# Patient Record
Sex: Female | Born: 1960 | Race: White | Hispanic: No | Marital: Married | Smoking: Never smoker
Health system: Southern US, Community
[De-identification: ages and names within clinical notes are randomized; demographics above are authoritative.]

## PROBLEM LIST (undated history)

## (undated) DIAGNOSIS — I10 Essential (primary) hypertension: Secondary | ICD-10-CM

## (undated) HISTORY — PX: EYE MUSCLE SURGERY: SHX370

## (undated) HISTORY — PX: ABDOMINAL HYSTERECTOMY: SHX81

---

## 2004-05-02 ENCOUNTER — Observation Stay (HOSPITAL_COMMUNITY): Admission: AD | Admit: 2004-05-02 | Discharge: 2004-05-03 | Payer: Self-pay | Admitting: Obstetrics & Gynecology

## 2004-06-12 ENCOUNTER — Inpatient Hospital Stay (HOSPITAL_COMMUNITY): Admission: RE | Admit: 2004-06-12 | Discharge: 2004-06-14 | Payer: Self-pay | Admitting: Obstetrics & Gynecology

## 2007-10-20 ENCOUNTER — Ambulatory Visit (HOSPITAL_COMMUNITY): Admission: RE | Admit: 2007-10-20 | Discharge: 2007-10-20 | Payer: Self-pay | Admitting: Obstetrics & Gynecology

## 2011-05-17 NOTE — Op Note (Signed)
Natalie Harris, Natalie Harris                       ACCOUNT NO.:  [id number]   MEDICAL RECORD NO.:  [id number]                   PATIENT TYPE:  AMB   LOCATION:  DAY                                  FACILITY:  [facility]   PHYSICIAN:  Lazaro Arms, M.D.                DATE OF BIRTH:  10/27/1961   DATE OF PROCEDURE:  06/12/2004  DATE OF DISCHARGE:                                 OPERATIVE REPORT   PREOPERATIVE DIAGNOSES:  1. Enlarged fibroid uterus, 20-week size.  2. Menometrorrhagia.   POSTOPERATIVE DIAGNOSES:  1. Enlarged fibroid uterus, 20-week size.  2. Menometrorrhagia.   PROCEDURE:  Abdominal hysterectomy.   SURGEON:  Lazaro Arms, M.D.   ANESTHESIA:  General endotracheal.   FINDINGS:  The patient had a multi-fibroid uterus.  The ovaries were quite  small and appeared to be perimenopausal.  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   DESCRIPTION OF PROCEDURE:  The patient was taken to the operating room and  placed in the supine position where she underwent general endotracheal  anesthesia.  She was prepped and draped in the usual sterile fashion,  including the vagina.   A Pfannenstiel skin incision was made and carried down sharply to the rectus  fascia.  The peritoneal cavity was entered in the usual fashion.  The uterus  was delivered through the incision.  There was no way to get a retractor in  at this time.  The Gyrus bipolar curved forceps was used, and the ovaries  were taken down off of the uterus.  The uterus basically filled the entire  pelvis from sidewall to sidewall and again rose out of the pelvis at the  level of the umbilicus.  The round ligaments were clamped, coagulated, and  cut.  The bladder was then taken down.  The bladder peritoneum was incised  from side to side, and the bladder was pushed down with a sponge stick.  I  clamped the uterine vessels  above and below and amputated the large fundus  in order to be able to get to the uterine vessels on each side.  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  Actually there were two.  The uterine vessels were clamped, cut, and suture ligated bilaterally.  Serial pedicles were taken down the cervix through the cardinal ligament,  each pedicle being clamped, cut, and suture ligated.  The vagina was  crossclamped, and the cervical stump and lower part of the uterus was  removed.  Vaginal angle sutures were placed, and the vagina was closed with  two interrupted figure-of-eight sutures.  There was good hemostasis of all  pedicles.  The pelvis was irrigated vigorously.  Interceed was placed in the  vaginal cuff to attempt to prevent adhesions in the future.  The  lap tapes  were removed.  The muscles were reapproximated loosely.  The fascia was  closed using 0 Vicryl running.  The subcutaneous tissue was made hemostatic  and irrigated.  A pain pump was placed in the subcutaneous tissue for  postoperative pain management.  The skin was closed using skin staples.   The patient tolerated the procedure well.  She experienced 600 cc of blood  loss and was taken to the recovery room in good and stable condition.  All  counts were correct.      ___________________________________________                                            Lazaro Arms, M.D.   LHE/MEDQ  D:  06/12/2004  T:  06/12/2004  Job:  [id number]

## 2011-05-17 NOTE — H&P (Signed)
NAME:  Natalie Harris, Natalie Harris                       ACCOUNT NO.:  [id number]   MEDICAL RECORD NO.:  [id number]                   PATIENT TYPE:  AMB   LOCATION:  DAY                                  FACILITY:  [facility]   PHYSICIAN:  Lazaro Arms, M.D.                DATE OF BIRTH:  03-Mar-1961   DATE OF ADMISSION:  06/12/2004  DATE OF DISCHARGE:                                HISTORY & PHYSICAL   HISTORY OF PRESENT ILLNESS:  Natalie Harris is a 50 year old white female, gravida  0, para 0, who I have followed in the office since May 02, 2004 and admitted  that day because of severe anemia.  Her hemoglobin was 3.6 and hematocrit  was 13.  She got 5 units of blood, which increased her hemoglobin up to 9.  She was placed on Megace therapy aggressively.  She has an enlarged fibroid  uterus, 18-week size, and preparations were made at that time for her to  undergo surgery on the 14th.  Ultrasound confirmed the presence of an  enlarged irregular fibroid uterus; as a result, Natalie Harris is admitted for  abdominal hysterectomy.   PAST MEDICAL HISTORY:  Past medical history negative except for a severe  anemia resulting from longstanding menometrorrhagia and requiring 5 units of  packed red blood cells on May 02, 2004.   PAST SURGICAL HISTORY:  Past surgical history is negative.   OBSTETRICAL HISTORY:  OB history is negative.   ALLERGIES:  Allergies are to AMOXICILLIN.   CURRENT MEDICATIONS:  Current medications are Megace and iron.   REVIEW OF SYSTEMS:  Review of systems is otherwise negative.   PHYSICAL EXAMINATION:  VITAL SIGNS:  Blood pressure is 140/70.  HEENT:  Unremarkable.  NECK:  Thyroid is normal.  LUNGS:  Lungs are clear.  HEART:  Heart is regular rate and rhythm without murmur, regurgitation or  gallop.  The systolic ejection murmur has resolved.  ABDOMEN:  Her abdominal exam is benign but there is a mass palpable on  abdominal exam.  PELVIC:  Her Pap smear was normal.  She has normal external  genitalia.  Vaginal is pink and moist without discharge.  The cervix is parous without  lesions.  Uterus is 18- to 20-week size.  Adnexa are negative.  EXTREMITIES:  Extremities are warm with no edema.   IMPRESSION:  1. Enlarged fibroid uterus, 18- to 20-week size.  2. History of menometrorrhagia, resolved on Megace.  3. Status post 5 units of packed red blood cells because of severe anemia,     symptomatic.   PLAN:  The patient is admitted for abdominal hysterectomy.  She understands  the risks, benefits, indications and alternatives and will proceed.     ___________________________________________  Lazaro Arms, M.D.   Loraine Maple  D:  06/11/2004  T:  06/12/2004  Job:  [id number]

## 2011-05-17 NOTE — H&P (Signed)
NAME:  Natalie, Harris NO.:  [id number]   MEDICAL RECORD NO.:  [id number]                  PATIENT TYPE:   LOCATION:                                       FACILITY:   PHYSICIAN:  Lazaro Arms, M.D.                DATE OF BIRTH:  [birth year]   DATE OF ADMISSION:  DATE OF DISCHARGE:                                HISTORY & PHYSICAL   HISTORY OF PRESENT ILLNESS:  The patient is a 50 year old white female  Gravida O, Para 0 with a last menstrual period which just ended on Apr 29, 2004 who came to the office for a yearly, but also with several systemic  complaints.  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  She also is complaining of her heart racing at  times and she has had some changes in her menstrual cycles over the last few  months.  She has most recently bled quite heavily for a three week period of  time with blood clotting heavier than what she is used to.  On examination I  noticed a 3/6 systolic ejection murmur and then looked at her conjunctivae  which were very pale.  I examined her and her uterus is 18 week size.  Putting those things together, we checked a blood count in the office.  A  fingerstick was a hemoglobin of 3.8 and we checked that at the hospital and  it was 3.6 and hematocrit was 13.0.  As a result, she is admitted to the  hospital to receive blood transfusions.  The severe anemia is almost  certainly from her heavy bleeding. Her platelet count is normal.  Her white  count is slightly lowered.  Her MCV is in the 60's consistent with iron  deficiency anemia.   PAST MEDICAL HISTORY:  Negative.   PAST SURGICAL HISTORY:  Negative.   OB HISTORY:  Negative.   ALLERGIES:  AMOXICILLIN.   CURRENT MEDICATIONS:  None.   REVIEW OF  SYMPTOMS:  Negative.   PHYSICAL EXAMINATION:  VITAL SIGNS:  Blood pressure 140/70.  HEENT: Unremarkable.  The thyroid is normal.  LUNGS:  Clear.  CARDIOVASCULAR EXAM:  Regular rate and rhythm as stated before, but a 3/6  systolic ejection murmur.  ABDOMEN:  Benign.  PELVIC:  I did feel a mass on examination on pelvic exam.  I did a Pap  smear.  External genitalia was normal but the pelvic exam revealed that to  be an enlarged uterus at 18 weeks size with multiple masses palpated  consistent with fibroids.  The adnexae are negative.  EXTREMITIES:  Warm with no edema.  NEUROLOGIC:  Grossly intact.   IMPRESSION:  1. Severe  iron deficiency anemia secondary to menometrorrhagia.  2. Enlarged fibroid uterus.  3. Systemic complaints related to #1.   PLAN:  The patient is admitted as an observation patient overnight to  receive blood transfusions.  We will then put her on Megace to cease her  menses and plan to do a hysterectomy on June 14.  The patient understands  the seriousness of her condition and the need for immediate hospitalization  and therapy.     ___________________________________________                                         Lazaro Arms, M.D.   LHE/MEDQ  D:  05/02/2004  T:  05/02/2004  Job:  [id number]

## 2020-03-18 ENCOUNTER — Ambulatory Visit: Payer: Self-pay | Attending: Internal Medicine

## 2020-03-18 DIAGNOSIS — Z23 Encounter for immunization: Secondary | ICD-10-CM

## 2020-03-18 NOTE — Progress Notes (Signed)
   Covid-19 Vaccination Clinic  Name:  Natalie Harris    MRN: [id number] DOB: 1961-04-12  03/18/2020  [name] was observed post Covid-19 immunization for 15 minutes without incident. She was provided with Vaccine Information Sheet and instruction to access the V-Safe system.   [name] was instructed to call 911 with any severe reactions post vaccine: Marland Kitchen Difficulty breathing  . Swelling of face and throat  . A fast heartbeat  . A bad rash all over body  . Dizziness and weakness   Immunizations Administered    Name Date Dose VIS Date Route   Moderna COVID-19 Vaccine 03/18/2020 11:11 AM 0.5 mL 11/30/2019 Intramuscular   Manufacturer: Moderna   Lot: [id number]   NDC: 06986-148-30

## 2020-04-25 ENCOUNTER — Ambulatory Visit: Payer: Self-pay | Attending: Internal Medicine

## 2020-04-25 DIAGNOSIS — Z23 Encounter for immunization: Secondary | ICD-10-CM

## 2020-04-25 NOTE — Progress Notes (Signed)
   Covid-19 Vaccination Clinic  Name:  ANNALISIA INGBER    MRN: [id number] DOB: [birth year]  04/25/2020  Ms. Furgason was observed post Covid-19 immunization for 15 minutes without incident. She was provided with Vaccine Information Sheet and instruction to access the V-Safe system.   Ms. Petrovich was instructed to call 911 with any severe reactions post vaccine: Marland Kitchen Difficulty breathing  . Swelling of face and throat  . A fast heartbeat  . A bad rash all over body  . Dizziness and weakness   Immunizations Administered    Name Date Dose VIS Date Route   Moderna COVID-19 Vaccine 04/25/2020 10:57 AM 0.5 mL 11/2019 Intramuscular   Manufacturer: Moderna   Lot: [id number]   NDC: 72419-542-48

## 2020-05-18 NOTE — H&P (Signed)
Surgical History & Physical  Patient Name: Natalie Harris DOB: 07-31-1961  Surgery: Cataract extraction with intraocular lens implant phacoemulsification; Right Eye  Surgeon: Fabio Pierce MD Surgery Date:  06/02/2020 Pre-Op Date:  05/15/2020  HPI: A 86 Yr. old female patient -Dr. Charise Killian referred for cataract eval 1. The patient complains of difficulty when driving, which began 1 years ago. Both eyes are affected. The episode is constant and gradual. The condition's severity decreased since last visit and is moderate. OD worse c/w OS distance and near. Symptoms occur when the patient is driving and reading. This is negatively affecting the patient's quality of life. Patient not able to see well with current bifocal CL OD c/w OS. OS cloudy as well but OD much worse. *Bifocal CL HPI Completed by Dr. Fabio Pierce  Medical History: Cataracts Allergies migraines (triggered by lights and/or s...  Review of Systems Negative Allergic/Immunologic Negative Cardiovascular Negative Constitutional Negative Ear, Nose, Mouth & Throat Negative Endocrine Negative Eyes Negative Gastrointestinal Negative Genitourinary Negative Hemotologic/Lymphatic Negative Integumentary Negative Musculoskeletal Negative Neurological Negative Psychiatry Negative Respiratory  Social   Never smoked   Medication Zyrtec,   Sx/Procedures  None  Drug Allergies  Amoxicillin,   History & Physical: Heent:  Cataract, Right eye NECK: supple without bruits LUNGS: lungs clear to auscultation CV: regular rate and rhythm Abdomen: soft and non-tender  Impression & Plan: Assessment: 1.  COMBINED FORMS AGE RELATED CATARACT; Both Eyes (H25.813) 2.  VITREOUS DETACHMENT PVD; Both Eyes (H43.813) 3.  ASTIGMATISM, REGULAR; Both Eyes (H52.223)  Plan: 1.  Cataract accounts for the patient's decreased vision. This visual impairment is not correctable with a tolerable change in glasses or contact lenses. Cataract  surgery with an implantation of a new lens should significantly improve the visual and functional status of the patient. Discussed all risks, benefits, alternatives, and potential complications. Discussed the procedures and recovery. Patient desires to have surgery. A-scan ordered and performed today for intra-ocular lens calculations. The surgery will be performed in order to improve vision for driving, reading, and for eye examinations. Recommend phacoemulsification with intra-ocular lens. Right Eye worse - first. Dilates well - shugarcaine by protocol. Conisder - Toric Lens. - DIUxxx. 2.  Old Asymptomatic. RD precautions given. Patient to call with increase in flashing lights/floaters/dark curtain. 3.  Consider Toric lens.

## 2020-05-30 ENCOUNTER — Encounter (HOSPITAL_COMMUNITY): Payer: Self-pay

## 2020-05-30 ENCOUNTER — Other Ambulatory Visit: Payer: Self-pay

## 2020-05-31 ENCOUNTER — Other Ambulatory Visit (HOSPITAL_COMMUNITY)
Admission: RE | Admit: 2020-05-31 | Discharge: 2020-05-31 | Disposition: A | Discharge status: Home / Self Care | Payer: BC Managed Care – PPO | Source: Ambulatory Visit | Attending: Ophthalmology | Admitting: Ophthalmology

## 2020-05-31 ENCOUNTER — Other Ambulatory Visit: Payer: Self-pay

## 2020-05-31 ENCOUNTER — Other Ambulatory Visit (HOSPITAL_COMMUNITY): Payer: Self-pay

## 2020-05-31 ENCOUNTER — Encounter (HOSPITAL_COMMUNITY)
Admission: RE | Admit: 2020-05-31 | Discharge: 2020-05-31 | Disposition: A | Discharge status: Home / Self Care | Payer: BC Managed Care – PPO | Source: Ambulatory Visit | Attending: Ophthalmology | Admitting: Ophthalmology

## 2020-05-31 DIAGNOSIS — Z01812 Encounter for preprocedural laboratory examination: Secondary | ICD-10-CM | POA: Diagnosis not present

## 2020-05-31 DIAGNOSIS — Z20822 Contact with and (suspected) exposure to covid-19: Secondary | ICD-10-CM | POA: Diagnosis not present

## 2020-05-31 LAB — SARS CORONAVIRUS 2 (TAT 6-24 HRS): SARS Coronavirus 2: NEGATIVE

## 2020-06-02 ENCOUNTER — Ambulatory Visit (HOSPITAL_COMMUNITY): Payer: BC Managed Care – PPO | Admitting: Anesthesiology

## 2020-06-02 ENCOUNTER — Encounter (HOSPITAL_COMMUNITY)
Admission: RE | Disposition: A | Discharge status: Home / Self Care | Payer: Self-pay | Source: Home / Self Care | Attending: Ophthalmology

## 2020-06-02 ENCOUNTER — Encounter (HOSPITAL_COMMUNITY): Payer: Self-pay | Admitting: Ophthalmology

## 2020-06-02 ENCOUNTER — Ambulatory Visit (HOSPITAL_COMMUNITY)
Admission: RE | Admit: 2020-06-02 | Discharge: 2020-06-02 | Disposition: A | Discharge status: Home / Self Care | Payer: BC Managed Care – PPO | Attending: Ophthalmology | Admitting: Ophthalmology

## 2020-06-02 DIAGNOSIS — Z880 Allergy status to penicillin: Secondary | ICD-10-CM | POA: Insufficient documentation

## 2020-06-02 DIAGNOSIS — H25811 Combined forms of age-related cataract, right eye: Secondary | ICD-10-CM | POA: Diagnosis not present

## 2020-06-02 DIAGNOSIS — H52201 Unspecified astigmatism, right eye: Secondary | ICD-10-CM | POA: Insufficient documentation

## 2020-06-02 DIAGNOSIS — H43813 Vitreous degeneration, bilateral: Secondary | ICD-10-CM | POA: Insufficient documentation

## 2020-06-02 HISTORY — PX: CATARACT EXTRACTION W/PHACO: SHX586

## 2020-06-02 SURGERY — PHACOEMULSIFICATION, CATARACT, WITH IOL INSERTION
Anesthesia: Monitor Anesthesia Care | Site: Eye | Laterality: Right

## 2020-06-02 MED ORDER — SODIUM HYALURONATE 23 MG/ML IO SOLN
INTRAOCULAR | Status: DC | PRN
  Administered 2020-06-02: 0.6 mL via INTRAOCULAR

## 2020-06-02 MED ORDER — TETRACAINE HCL 0.5 % OP SOLN
1.0000 [drp] | OPHTHALMIC | Status: AC | PRN
  Administered 2020-06-02 (×3): 1 [drp] via OPHTHALMIC

## 2020-06-02 MED ORDER — BSS IO SOLN
INTRAOCULAR | Status: DC | PRN
  Administered 2020-06-02: 15 mL via INTRAOCULAR

## 2020-06-02 MED ORDER — LIDOCAINE HCL 3.5 % OP GEL: 1.0000 "application " | Freq: Once | OPHTHALMIC | Status: DC

## 2020-06-02 MED ORDER — PROVISC 10 MG/ML IO SOLN
INTRAOCULAR | Status: DC | PRN
  Administered 2020-06-02: 0.85 mL via INTRAOCULAR

## 2020-06-02 MED ORDER — LIDOCAINE HCL (PF) 1 % IJ SOLN
INTRAOCULAR | Status: DC | PRN
  Administered 2020-06-02: 1 mL via OPHTHALMIC

## 2020-06-02 MED ORDER — PHENYLEPHRINE HCL 2.5 % OP SOLN
1.0000 [drp] | OPHTHALMIC | Status: AC | PRN
  Administered 2020-06-02 (×3): 1 [drp] via OPHTHALMIC

## 2020-06-02 MED ORDER — POVIDONE-IODINE 5 % OP SOLN
OPHTHALMIC | Status: DC | PRN
  Administered 2020-06-02: 1 via OPHTHALMIC

## 2020-06-02 MED ORDER — NEOMYCIN-POLYMYXIN-DEXAMETH 3.5-10000-0.1 OP SUSP
OPHTHALMIC | Status: DC | PRN
  Administered 2020-06-02: 1 [drp] via OPHTHALMIC

## 2020-06-02 MED ORDER — BSS IO SOLN
INTRAOCULAR | Status: DC | PRN
  Administered 2020-06-02: 500 mL

## 2020-06-02 MED ORDER — CYCLOPENTOLATE-PHENYLEPHRINE 0.2-1 % OP SOLN
1.0000 [drp] | OPHTHALMIC | Status: AC | PRN
  Administered 2020-06-02 (×3): 1 [drp] via OPHTHALMIC

## 2020-06-02 SURGICAL SUPPLY — 15 items
CLOTH BEACON ORANGE TIMEOUT ST (SAFETY) ×3 IMPLANT
EYE SHIELD UNIVERSAL CLEAR (GAUZE/BANDAGES/DRESSINGS) ×3 IMPLANT
GLOVE BIOGEL PI IND STRL 7.0 (GLOVE) ×2 IMPLANT
GLOVE BIOGEL PI INDICATOR 7.0 (GLOVE) ×4
LENS IOL TECNIS TORIC II 10.5 ×3 IMPLANT
LENS IOL TECNIS TRC II 10.5 ×1 IMPLANT
NEEDLE HYPO 18GX1.5 BLUNT FILL (NEEDLE) ×3 IMPLANT
PAD ARMBOARD 7.5X6 YLW CONV (MISCELLANEOUS) ×3 IMPLANT
PROC W SPEC LENS (INTRAOCULAR LENS) ×3
PROCESS W SPEC LENS (INTRAOCULAR LENS) ×1 IMPLANT
SYR TB 1ML LL NO SAFETY (SYRINGE) ×3 IMPLANT
TAPE SURG TRANSPORE 1 IN (GAUZE/BANDAGES/DRESSINGS) ×1 IMPLANT
TAPE SURGICAL TRANSPORE 1 IN (GAUZE/BANDAGES/DRESSINGS) ×2
VISCOELASTIC ADDITIONAL (OPHTHALMIC RELATED) ×3 IMPLANT
WATER STERILE IRR 250ML POUR (IV SOLUTION) ×3 IMPLANT

## 2020-06-02 NOTE — Anesthesia Postprocedure Evaluation (Signed)
Anesthesia Post Note  Patient: Natalie Harris  Procedure(s) Performed: CATARACT EXTRACTION PHACO AND INTRAOCULAR LENS PLACEMENT (IOC) (Right Eye)  Patient location during evaluation: PACU Anesthesia Type: MAC Level of consciousness: awake and alert and oriented Pain management: pain level controlled Vital Signs Assessment: post-procedure vital signs reviewed and stable Respiratory status: spontaneous breathing Cardiovascular status: blood pressure returned to baseline and stable Postop Assessment: no apparent nausea or vomiting Anesthetic complications: no     Last Vitals: There were no vitals filed for this visit.  Last Pain: There were no vitals filed for this visit.               Sakai Heinle

## 2020-06-02 NOTE — Discharge Instructions (Signed)
Please discharge patient when stable, will follow up today with Dr. Annelie Boak at the [facility] office immediately following discharge.  Leave shield in place until visit.  All paperwork with discharge instructions will be given at the office.  [facility] Address:  [address]  , [place] [address]  

## 2020-06-02 NOTE — Transfer of Care (Signed)
Immediate Anesthesia Transfer of Care Note  Patient: Natalie Harris  Procedure(s) Performed: CATARACT EXTRACTION PHACO AND INTRAOCULAR LENS PLACEMENT (IOC) (Right Eye)  Patient Location: PACU  Anesthesia Type:MAC  Level of Consciousness: awake  Airway & Oxygen Therapy: Patient Spontanous Breathing  Post-op Assessment: Report given to RN  Post vital signs: Reviewed  Last Vitals:  Vitals Value Taken Time  BP    Temp    Pulse    Resp    SpO2      Last Pain: There were no vitals filed for this visit.       Complications: No apparent anesthesia complications

## 2020-06-02 NOTE — Anesthesia Preprocedure Evaluation (Signed)
Anesthesia Evaluation  Patient identified by MRN, date of birth, ID band Patient awake    Reviewed: Allergy & Precautions, NPO status , Patient's Chart, lab work & pertinent test results  History of Anesthesia Complications Negative for: history of anesthetic complications  Airway Mallampati: II  TM Distance: >3 FB Neck ROM: Full    Dental no notable dental hx.    Pulmonary    Pulmonary exam normal breath sounds clear to auscultation       Cardiovascular Exercise Tolerance: Good Normal cardiovascular exam Rhythm:Regular Rate:Normal     Neuro/Psych negative neurological ROS  negative psych ROS   GI/Hepatic Neg liver ROS, GERD  Controlled,  Endo/Other  Morbid obesity (bmi 37.4)  Renal/GU negative Renal ROS     Musculoskeletal negative musculoskeletal ROS (+)   Abdominal   Peds  Hematology negative hematology ROS (+)   Anesthesia Other Findings   Reproductive/Obstetrics negative OB ROS                             Anesthesia Physical Anesthesia Plan  ASA: II  Anesthesia Plan: MAC   Post-op Pain Management:    Induction:   PONV Risk Score and Plan: 2  Airway Management Planned: Nasal Cannula and Natural Airway  Additional Equipment:   Intra-op Plan:   Post-operative Plan:   Informed Consent: I have reviewed the patients History and Physical, chart, labs and discussed the procedure including the risks, benefits and alternatives for the proposed anesthesia with the patient or authorized representative who has indicated his/her understanding and acceptance.     Dental advisory given  Plan Discussed with: CRNA and Surgeon  Anesthesia Plan Comments:         Anesthesia Quick Evaluation

## 2020-06-02 NOTE — Interval H&P Note (Signed)
History and Physical Interval Note: The H and P was reviewed and updated. The patient was examined.  No changes were found after exam.  The surgical eye was marked.   [facility] Address:  [address]  [place], [place] [address]  06/02/2020 8:58 AM  Natalie Harris  has presented today for surgery, with the diagnosis of Nuclear sclerotic cataract - Right eye.  The various methods of treatment have been discussed with the patient and family. After consideration of risks, benefits and other options for treatment, the patient has consented to  Procedure(s) with comments: CATARACT EXTRACTION PHACO AND INTRAOCULAR LENS PLACEMENT (IOC) (Right) - right as a surgical intervention.  The patient's history has been reviewed, patient examined, no change in status, stable for surgery.  I have reviewed the patient's chart and labs.  Questions were answered to the patient's satisfaction.     Fabio Pierce

## 2020-06-02 NOTE — Op Note (Signed)
Date of procedure: 06/02/20  Pre-operative diagnosis: Visually significant combined-form age-related cataract, Right Eye; Visually Significant Astigmatism, Right Eye (H25.811)  Post-operative diagnosis: Visually significant age-related cataract, Right Eye; Visually Significant Astigmatism, Right Eye  Procedure: Removal of cataract via phacoemulsification and insertion of intra-ocular lens AMO ZCU150 +10.5D into the capsular bag of the Right Eye  Attending surgeon: Gerda Diss. Rosaleigh Brazzel, MD, MA  Anesthesia: MAC, Topical Akten  Complications: None  Estimated Blood Loss: <67m (minimal)  Specimens: None  Implants: As above  Indications:  Visually significant age-related cataract, Right Eye; Visually Significant Astigmatism, Right Eye  Procedure:  The patient was seen and identified in the pre-operative area. The operative eye was identified and dilated.  The operative eye was marked.  Pre-operative toric markers were used to mark the eye at 0 and 180 degrees. Topical anesthesia was administered to the operative eye.     The patient was then to the operative suite and placed in the supine position.  A timeout was performed confirming the patient, procedure to be performed, and all other relevant information.   The patient's face was prepped and draped in the usual fashion for intra-ocular surgery.  A lid speculum was placed into the operative eye and the surgical microscope moved into place and focused.  A superotemporal paracentesis was created using a 20 gauge paracentesis blade.  Shugarcaine was injected into the anterior chamber.  Viscoelastic was injected into the anterior chamber.  A temporal clear-corneal main wound incision was created using a 2.464mmicrokeratome.  A continuous curvilinear capsulorrhexis was initiated using an irrigating cystitome and completed using capsulorrhexis forceps.  Hydrodissection and hydrodeliniation were performed.  Viscoelastic was injected into the anterior  chamber.  A phacoemulsification handpiece and a chopper as a second instrument were used to remove the nucleus and epinucleus. The irrigation/aspiration handpiece was used to remove any remaining cortical material.   The capsular bag was reinflated with viscoelastic, checked, and found to be intact.  The intraocular lens was inserted into the capsular bag and dialed into place using a Kuglen hook to 109 degrees.  The irrigation/aspiration handpiece was used to remove any remaining viscoelastic.  The clear corneal wound and paracentesis wounds were then hydrated and checked with Weck-Cels to be watertight.  The lid-speculum and drape was removed, and the patient's face was cleaned with a wet and dry 4x4.  Maxitrol was instilled in the eye before a clear shield was taped over the eye. The patient was taken to the post-operative care unit in good condition, having tolerated the procedure well.  Post-Op Instructions: [facility] and will receive all other orders and instructions.

## 2020-06-02 NOTE — Progress Notes (Signed)
Dr. Bill Salinas in to see patient regarding blood pressures. Patient agrees to see PCP ASAP.  Dr. Bill Salinas advised that blood pressure should not affect her eye pressure.    Patient declines to have further evaluation at present.Denies headache.

## 2020-06-05 ENCOUNTER — Other Ambulatory Visit: Payer: Self-pay

## 2020-06-05 ENCOUNTER — Ambulatory Visit
Admission: EM | Admit: 2020-06-05 | Discharge: 2020-06-05 | Disposition: A | Discharge status: Home / Self Care | Payer: BC Managed Care – PPO | Attending: Emergency Medicine | Admitting: Emergency Medicine

## 2020-06-05 DIAGNOSIS — R030 Elevated blood-pressure reading, without diagnosis of hypertension: Secondary | ICD-10-CM | POA: Diagnosis not present

## 2020-06-05 MED ORDER — LISINOPRIL 10 MG PO TABS: 10.0000 mg | ORAL_TABLET | Freq: Every day | ORAL | 0 refills | Status: DC

## 2020-06-05 NOTE — ED Provider Notes (Signed)
[facility]   [id number] 06/05/20 Arrival Time: 2376  CC: elevated blood pressure  SUBJECTIVE:  Natalie Harris is a 59 y.o. female who complains of elevated blood pressure 4 days ago.  No hx of HTN.  Blood pressure was 218/101 at doctors office.  Was getting ready to have cataracts surgery.  Does not take blood pressure medications.  Does not have a PCP.  Denies HA, vision changes, dizziness, lightheadedness, chest pain, shortness of breath, numbness or tingling in extremities, abdominal pain, changes in bowel or bladder habits.      ROS: As per HPI.  All other pertinent ROS negative.     History reviewed. No pertinent past medical history. Past Surgical History:  Procedure Laterality Date  . ABDOMINAL HYSTERECTOMY    . EYE MUSCLE SURGERY     Allergies  Allergen Reactions  . Amoxicillin Hives and Rash   No current facility-administered medications on file prior to encounter.   Current Outpatient Medications on File Prior to Encounter  Medication Sig Dispense Refill  . cetirizine (ZYRTEC) 10 MG tablet Take 10 mg by mouth daily.    . cholecalciferol (VITAMIN D) 25 MCG (1000 UNIT) tablet Take 1,000 Units by mouth daily.    . naproxen sodium (ALEVE) 220 MG tablet Take 440 mg by mouth daily.    . Prednisolon-Gatiflox-Bromfenac 1-0.5-0.075 % SOLN Place 1 drop into the right eye daily.     Social History   Socioeconomic History  . Marital status: Married    Spouse name: Not on file  . Number of children: Not on file  . Years of education: Not on file  . Highest education level: Not on file  Occupational History  . Not on file  Tobacco Use  . Smoking status: Never Smoker  . Smokeless tobacco: Never Used  Substance and Sexual Activity  . Alcohol use: Not on file    Comment: occassional  . Drug use: Never  . Sexual activity: Yes  Other Topics Concern  . Not on file  Social History Narrative  . Not on file   Social Determinants of Health   Financial  Resource Strain:   . Difficulty of Paying Living Expenses:   Food Insecurity:   . Worried About Charity fundraiser in the Last Year:   . Arboriculturist in the Last Year:   Transportation Needs:   . Film/video editor (Medical):   Marland Kitchen Lack of Transportation (Non-Medical):   Physical Activity:   . Days of Exercise per Week:   . Minutes of Exercise per Session:   Stress:   . Feeling of Stress :   Social Connections:   . Frequency of Communication with Friends and Family:   . Frequency of Social Gatherings with Friends and Family:   . Attends Religious Services:   . Active Member of Clubs or Organizations:   . Attends Archivist Meetings:   Marland Kitchen Marital Status:   Intimate Partner Violence:   . Fear of Current or Ex-Partner:   . Emotionally Abused:   Marland Kitchen Physically Abused:   . Sexually Abused:    Family History  Problem Relation Age of Onset  . Hypertension Father     OBJECTIVE:  Vitals:   06/05/20 1707  BP: (!) 182/83  Pulse: 85  Resp: 20  Temp: 98 F (36.7 C)  SpO2: 96%    General appearance: alert; no distress Eyes: PERRLA; EOMI HENT: normocephalic; atraumatic Neck: supple with FROM Lungs: clear to auscultation  bilaterally Heart: regular rate and rhythm.   Extremities: no edema; symmetrical with no gross deformities Skin: warm and dry Neurologic: ambulates without difficulty Psychological: alert and cooperative; normal mood and affect   ASSESSMENT & PLAN:  1. Elevated blood pressure reading     Meds ordered this encounter  Medications  . lisinopril (ZESTRIL) 10 MG tablet    Sig: Take 1 tablet (10 mg total) by mouth daily.    Dispense:  30 tablet    Refill:  0    Order Specific Question:   Supervising Provider    Answer:   Eustace Moore [[id number]]   Medication prescribed Please continue to monitor blood pressure at home and keep a log Eat a well balanced diet of fruits, vegetables and lean meats.  Avoid foods high in fat and salt Drink  water.  At least half your body weight in ounces Exercise for at least 30 minutes daily Return or go to the ED if you have any new or worsening symptoms such as vision changes, fatigue, dizziness, chest pain, shortness of breath, nausea, swelling in your hands or feet, urinary symptoms, etc...  Reviewed expectations re: course of current medical issues. Questions answered. Outlined signs and symptoms indicating need for more acute intervention. Patient verbalized understanding. After Visit Summary given.   Rennis Harding, PA-C 06/05/20 1742

## 2020-06-05 NOTE — Discharge Instructions (Signed)
Medication prescribed Please continue to monitor blood pressure at home and keep a log Eat a well balanced diet of fruits, vegetables and lean meats.  Avoid foods high in fat and salt Drink water.  At least half your body weight in ounces Exercise for at least 30 minutes daily Return or go to the ED if you have any new or worsening symptoms such as vision changes, fatigue, dizziness, chest pain, shortness of breath, nausea, swelling in your hands or feet, urinary symptoms, etc..Marland Kitchen

## 2020-06-05 NOTE — ED Triage Notes (Signed)
Pt had cataract surgery and had high blood pressure after procedure and was told by surgeon to have checked prior to next procedure scheduled . Unable to get in with pcp

## 2020-06-09 NOTE — H&P (Signed)
Surgical History & Physical  Patient Name: Natalie Harris DOB: [birth year]  Surgery: Cataract extraction with intraocular lens implant phacoemulsification; Left Eye  Surgeon: Fabio Pierce MD Surgery Date:  06/16/2020 Pre-Op Date:  06/08/2020  HPI: A 10 Yr. old female patient 1. 1. The patient is returning after cataract surgery. The right eye is affected. Status post cataract surgery on 06-02-2020: Onset was S/P Toric. Since the last visit, the affected area feels improvement. The patient's vision is improved. Patient is following medication instructions with 3 in 1 drops TID. Patient unable to tolerate difference between OU without correction. This is negatively affecting the patient's quality of life. Has to wear CL OS but feels VA extremely blurred OS c/w OD. OD much better, brighter, and clearer since surgery at distance and PC. [place] extremely blurred. *Never had to wear readers with CL in past and would like to discuss options.  Medical History: Cataracts  Review of Systems Negative Allergic/Immunologic Negative Cardiovascular Negative Constitutional Negative Ear, Nose, Mouth & Throat Negative Endocrine Negative Eyes Negative Gastrointestinal Negative Genitourinary Negative Hemotologic/Lymphatic Negative Integumentary Negative Musculoskeletal Negative Neurological Negative Psychiatry Negative Respiratory  Social   Never smoked  Medication Prednisolone-gatiflox-bromfenac,  Zyrtec,   Sx/Procedures Phaco c IOL OD-Toric,   Drug Allergies  Amoxicillin,   History & Physical: Heent:  Cataract, Left eye NECK: supple without bruits LUNGS: lungs clear to auscultation CV: regular rate and rhythm Abdomen: soft and non-tender  Impression & Plan: Assessment: 1.  COMBINED FORMS AGE RELATED CATARACT; Left Eye (H25.812) 2.  CATARACT EXTRACTION STATUS; Right Eye (Z98.41) 3.  ASTIGMATISM, REGULAR; Both Eyes (H52.223) 4.  INTRAOCULAR LENS IOL (Z96.1) - Resolved 5.   Myopia (H52.12)  Plan: 1.  Cataract accounts for the patient's decreased vision. This visual impairment is not correctable with a tolerable change in glasses or contact lenses. Cataract surgery with an implantation of a new lens should significantly improve the visual and functional status of the patient. Discussed all risks, benefits, alternatives, and potential complications. Discussed the procedures and recovery. Patient desires to have surgery. A-scan ordered and performed today for intra-ocular lens calculations. The surgery will be performed in order to improve vision for driving, reading, and for eye examinations. Recommend phacoemulsification with intra-ocular lens. Left Eye. Surgery required to correct imbalance of vision. Dilates well - shugarcaine by protocol. Toric Lens. - prefer DIUxxx 2.  1 week after cataract surgery. Doing well with improved vision and normal eye pressure. Call with any problems or concerns. Continue Gati-Brom-Pred 2x/day for 3 more weeks. 3.  Consider Toric lens.

## 2020-06-12 NOTE — Patient Instructions (Signed)
° °   Natalie Harris  06/12/2020     @PREFPERIOPPHARMACY @   Your procedure is scheduled on  06/16/2020 .  Report to [facility] at  0930  A.M.  Call this number if you have problems the morning of surgery:  [phone]   Remember:  Do not eat or drink after midnight.                        Take these medicines the morning of surgery with A SIP OF WATER: Zyrtec    Do not wear jewelry, make-up or nail polish.  Do not wear lotions, powders, or perfumes. Please wear deodorant and brush your teeth.  Do not shave 48 hours prior to surgery.  Men may shave face and neck.  Do not bring valuables to the hospital.  [facility] is not responsible for any belongings or valuables.  Contacts, dentures or bridgework may not be worn into surgery.  Leave your suitcase in the car.  After surgery it may be brought to your room.  For patients admitted to the hospital, discharge time will be determined by your treatment team.  Patients discharged the day of surgery will not be allowed to drive home.   Name and phone number of your driver:   family Special instructions:  DO NOT smoke the morning of your procedure  Please read over the following fact sheets that you were given. Anesthesia Post-op Instructions and Care and Recovery After Surgery

## 2020-06-13 ENCOUNTER — Other Ambulatory Visit: Payer: Self-pay

## 2020-06-13 ENCOUNTER — Encounter (HOSPITAL_COMMUNITY)
Admission: RE | Admit: 2020-06-13 | Discharge: 2020-06-13 | Disposition: A | Discharge status: Home / Self Care | Payer: BC Managed Care – PPO | Source: Ambulatory Visit | Attending: Ophthalmology | Admitting: Ophthalmology

## 2020-06-13 ENCOUNTER — Encounter (HOSPITAL_COMMUNITY): Payer: Self-pay

## 2020-06-14 ENCOUNTER — Other Ambulatory Visit: Payer: Self-pay

## 2020-06-14 ENCOUNTER — Other Ambulatory Visit (HOSPITAL_COMMUNITY)
Admission: RE | Admit: 2020-06-14 | Discharge: 2020-06-14 | Disposition: A | Discharge status: Home / Self Care | Payer: BC Managed Care – PPO | Source: Ambulatory Visit | Attending: Ophthalmology | Admitting: Ophthalmology

## 2020-06-14 DIAGNOSIS — Z01812 Encounter for preprocedural laboratory examination: Secondary | ICD-10-CM | POA: Insufficient documentation

## 2020-06-14 DIAGNOSIS — Z20822 Contact with and (suspected) exposure to covid-19: Secondary | ICD-10-CM | POA: Diagnosis not present

## 2020-06-15 LAB — SARS CORONAVIRUS 2 (TAT 6-24 HRS): SARS Coronavirus 2: NEGATIVE

## 2020-06-16 ENCOUNTER — Ambulatory Visit (HOSPITAL_COMMUNITY): Payer: BC Managed Care – PPO | Admitting: Anesthesiology

## 2020-06-16 ENCOUNTER — Other Ambulatory Visit: Payer: Self-pay

## 2020-06-16 ENCOUNTER — Ambulatory Visit (HOSPITAL_COMMUNITY)
Admission: RE | Admit: 2020-06-16 | Discharge: 2020-06-16 | Disposition: A | Discharge status: Home / Self Care | Payer: BC Managed Care – PPO | Attending: Ophthalmology | Admitting: Ophthalmology

## 2020-06-16 ENCOUNTER — Encounter (HOSPITAL_COMMUNITY)
Admission: RE | Disposition: A | Discharge status: Home / Self Care | Payer: Self-pay | Source: Home / Self Care | Attending: Ophthalmology

## 2020-06-16 ENCOUNTER — Encounter (HOSPITAL_COMMUNITY): Payer: Self-pay | Admitting: Ophthalmology

## 2020-06-16 DIAGNOSIS — Z9841 Cataract extraction status, right eye: Secondary | ICD-10-CM | POA: Diagnosis not present

## 2020-06-16 DIAGNOSIS — H52202 Unspecified astigmatism, left eye: Secondary | ICD-10-CM | POA: Insufficient documentation

## 2020-06-16 DIAGNOSIS — H5210 Myopia, unspecified eye: Secondary | ICD-10-CM | POA: Diagnosis not present

## 2020-06-16 DIAGNOSIS — H25812 Combined forms of age-related cataract, left eye: Secondary | ICD-10-CM | POA: Diagnosis present

## 2020-06-16 DIAGNOSIS — Z961 Presence of intraocular lens: Secondary | ICD-10-CM | POA: Diagnosis not present

## 2020-06-16 HISTORY — PX: CATARACT EXTRACTION W/PHACO: SHX586

## 2020-06-16 SURGERY — PHACOEMULSIFICATION, CATARACT, WITH IOL INSERTION
Anesthesia: Monitor Anesthesia Care | Site: Eye | Laterality: Left

## 2020-06-16 MED ORDER — TETRACAINE HCL 0.5 % OP SOLN
1.0000 [drp] | OPHTHALMIC | Status: AC | PRN
  Administered 2020-06-16 (×3): 1 [drp] via OPHTHALMIC

## 2020-06-16 MED ORDER — EPINEPHRINE PF 1 MG/ML IJ SOLN
INTRAOCULAR | Status: DC | PRN
  Administered 2020-06-16: 500 mL

## 2020-06-16 MED ORDER — NEOMYCIN-POLYMYXIN-DEXAMETH 3.5-10000-0.1 OP SUSP
OPHTHALMIC | Status: DC | PRN
  Administered 2020-06-16: 1 [drp] via OPHTHALMIC

## 2020-06-16 MED ORDER — PROVISC 10 MG/ML IO SOLN
INTRAOCULAR | Status: DC | PRN
  Administered 2020-06-16: 0.85 mL via INTRAOCULAR

## 2020-06-16 MED ORDER — BSS IO SOLN
INTRAOCULAR | Status: DC | PRN
  Administered 2020-06-16: 15 mL via INTRAOCULAR

## 2020-06-16 MED ORDER — PHENYLEPHRINE HCL 2.5 % OP SOLN
1.0000 [drp] | OPHTHALMIC | Status: AC | PRN
  Administered 2020-06-16 (×3): 1 [drp] via OPHTHALMIC

## 2020-06-16 MED ORDER — POVIDONE-IODINE 5 % OP SOLN
OPHTHALMIC | Status: DC | PRN
  Administered 2020-06-16: 1 via OPHTHALMIC

## 2020-06-16 MED ORDER — CYCLOPENTOLATE-PHENYLEPHRINE 0.2-1 % OP SOLN
1.0000 [drp] | OPHTHALMIC | Status: AC | PRN
  Administered 2020-06-16 (×3): 1 [drp] via OPHTHALMIC

## 2020-06-16 MED ORDER — SODIUM HYALURONATE 23 MG/ML IO SOLN
INTRAOCULAR | Status: DC | PRN
  Administered 2020-06-16: 0.6 mL via INTRAOCULAR

## 2020-06-16 MED ORDER — LIDOCAINE HCL (PF) 1 % IJ SOLN
INTRAOCULAR | Status: DC | PRN
  Administered 2020-06-16: 1 mL via OPHTHALMIC

## 2020-06-16 MED ORDER — LIDOCAINE HCL 3.5 % OP GEL
1.0000 "application " | Freq: Once | OPHTHALMIC | Status: AC
  Administered 2020-06-16: 1 via OPHTHALMIC

## 2020-06-16 SURGICAL SUPPLY — 15 items
CLOTH BEACON ORANGE TIMEOUT ST (SAFETY) ×3 IMPLANT
EYE SHIELD UNIVERSAL CLEAR (GAUZE/BANDAGES/DRESSINGS) ×3 IMPLANT
GLOVE BIOGEL PI IND STRL 7.0 (GLOVE) ×2 IMPLANT
GLOVE BIOGEL PI INDICATOR 7.0 (GLOVE) ×4
LENS IOL EYHANCE TORIC II 11.5 ×3 IMPLANT
LENS IOL EYHANCE TRC II 11.5 ×1 IMPLANT
NEEDLE HYPO 18GX1.5 BLUNT FILL (NEEDLE) ×3 IMPLANT
PAD ARMBOARD 7.5X6 YLW CONV (MISCELLANEOUS) ×3 IMPLANT
PROC W SPEC LENS (INTRAOCULAR LENS) ×3
PROCESS W SPEC LENS (INTRAOCULAR LENS) ×1 IMPLANT
SYR TB 1ML LL NO SAFETY (SYRINGE) ×3 IMPLANT
TAPE SURG TRANSPORE 1 IN (GAUZE/BANDAGES/DRESSINGS) ×1 IMPLANT
TAPE SURGICAL TRANSPORE 1 IN (GAUZE/BANDAGES/DRESSINGS) ×3
VISCOELASTIC ADDITIONAL (OPHTHALMIC RELATED) ×3 IMPLANT
WATER STERILE IRR 250ML POUR (IV SOLUTION) ×3 IMPLANT

## 2020-06-16 NOTE — Anesthesia Postprocedure Evaluation (Signed)
Anesthesia Post Note  Patient: Natalie Harris  Procedure(s) Performed: CATARACT EXTRACTION PHACO AND INTRAOCULAR LENS PLACEMENT LEFT EYE (Left Eye)  Patient location during evaluation: Short Stay Anesthesia Type: MAC Level of consciousness: awake and alert Pain management: pain level controlled Vital Signs Assessment: post-procedure vital signs reviewed and stable Respiratory status: spontaneous breathing Cardiovascular status: stable Postop Assessment: no apparent nausea or vomiting Anesthetic complications: no   No complications documented.   Last Vitals:  Vitals:   06/16/20 1011 06/16/20 1100  BP: (!) 161/86 (!) 138/91  Pulse:  69  Resp: 18 15  Temp: 36.9 C   SpO2: 98% 100%    Last Pain:  Vitals:   06/16/20 1011  TempSrc: Oral  PainSc: 0-No pain                 Everette Rank

## 2020-06-16 NOTE — Anesthesia Preprocedure Evaluation (Signed)
Anesthesia Evaluation  Patient identified by MRN, date of birth, ID band Patient awake    Reviewed: Allergy & Precautions, H&P , NPO status , Patient's Chart, lab work & pertinent test results, reviewed documented beta blocker date and time   Airway Mallampati: II  TM Distance: >3 FB Neck ROM: full    Dental no notable dental hx.    Pulmonary neg pulmonary ROS,    Pulmonary exam normal breath sounds clear to auscultation       Cardiovascular Exercise Tolerance: Good negative cardio ROS   Rhythm:regular Rate:Normal     Neuro/Psych negative neurological ROS  negative psych ROS   GI/Hepatic negative GI ROS, Neg liver ROS,   Endo/Other  negative endocrine ROS  Renal/GU negative Renal ROS  negative genitourinary   Musculoskeletal   Abdominal   Peds  Hematology negative hematology ROS (+)   Anesthesia Other Findings   Reproductive/Obstetrics negative OB ROS                             Anesthesia Physical Anesthesia Plan  ASA: II  Anesthesia Plan: MAC   Post-op Pain Management:    Induction:   PONV Risk Score and Plan: 3  Airway Management Planned:   Additional Equipment:   Intra-op Plan:   Post-operative Plan:   Informed Consent: I have reviewed the patients History and Physical, chart, labs and discussed the procedure including the risks, benefits and alternatives for the proposed anesthesia with the patient or authorized representative who has indicated his/her understanding and acceptance.     Dental Advisory Given  Plan Discussed with: CRNA  Anesthesia Plan Comments:         Anesthesia Quick Evaluation

## 2020-06-16 NOTE — Interval H&P Note (Signed)
History and Physical Interval Note:  06/16/2020 11:13 AM  Natalie Harris  has presented today for surgery, with the diagnosis of Nuclear sclerotic cataract - Left eye.  The various methods of treatment have been discussed with the patient and family. After consideration of risks, benefits and other options for treatment, the patient has consented to  Procedure(s) with comments: CATARACT EXTRACTION PHACO AND INTRAOCULAR LENS PLACEMENT (IOC) (Left) - CDE:  as a surgical intervention.  The patient's history has been reviewed, patient examined, no change in status, stable for surgery.  I have reviewed the patient's chart and labs.  Questions were answered to the patient's satisfaction.     Fabio Pierce

## 2020-06-16 NOTE — Op Note (Signed)
Date of procedure: 10/02/18  Pre-operative diagnosis: Visually significant combined-form age-related cataract, Left Eye; Visually Significant Astigmatism, Left Eye (H25.812)  Post-operative diagnosis: Visually significant age-related cataract, Left Eye; Visually Significant Astigmatism, Left Eye  Procedure: Removal of cataract via phacoemulsification and insertion of intra-ocular lens Wynetta Emery and Johnson [id number] +11.5D into the capsular bag of the Left Eye  Attending surgeon: Gerda Diss. Aleyna Cueva, MD, MA  Anesthesia: MAC, Topical Akten  Complications: None  Estimated Blood Loss: <24m (minimal)  Specimens: None  Implants: As above  Indications:  Visually significant age-related cataract, Left Eye; Visually Significant Astigmatism, Left Eye  Procedure:  The patient was seen and identified in the pre-operative area. The operative eye was identified and dilated.  The operative eye was marked.  Pre-operative toric markers were used to mark the eye at 0 and 180 degrees. Topical anesthesia was administered to the operative eye.     The patient was then to the operative suite and placed in the supine position.  A timeout was performed confirming the patient, procedure to be performed, and all other relevant information.   The patient's face was prepped and draped in the usual fashion for intra-ocular surgery.  A lid speculum was placed into the operative eye and the surgical microscope moved into place and focused.  A superotemporal paracentesis was created using a 20 gauge paracentesis blade.  Shugarcaine was injected into the anterior chamber.  Viscoelastic was injected into the anterior chamber.  A temporal clear-corneal main wound incision was created using a 2.411mmicrokeratome.  A continuous curvilinear capsulorrhexis was initiated using an irrigating cystitome and completed using capsulorrhexis forceps.  Hydrodissection and hydrodeliniation were performed.  Viscoelastic was injected into the  anterior chamber.  A phacoemulsification handpiece and a chopper as a second instrument were used to remove the nucleus and epinucleus. The irrigation/aspiration handpiece was used to remove any remaining cortical material.   The capsular bag was reinflated with viscoelastic, checked, and found to be intact.  The eye was marked to the per-op meridian.  The intraocular lens was inserted into the capsular bag and dialed into place using a Kuglen hook to 77 degrees.  The irrigation/aspiration handpiece was used to remove any remaining viscoelastic.  The clear corneal wound and paracentesis wounds were then hydrated and checked with Weck-Cels to be watertight.  The lid-speculum and drape was removed, and the patient's face was cleaned with a wet and dry 4x4.  Maxitrol was instilled in the eye before a clear shield was taped over the eye. The patient was taken to the post-operative care unit in good condition, having tolerated the procedure well.  Post-Op Instructions: [facility] and will receive all other orders and instructions.

## 2020-06-16 NOTE — Discharge Instructions (Signed)
Please discharge patient when stable, will follow up today with Dr. Shyquan Stallbaumer at the [facility] office immediately following discharge.  Leave shield in place until visit.  All paperwork with discharge instructions will be given at the office.  [facility] Address:  [address]  [place], [place] [address]  

## 2020-06-16 NOTE — Transfer of Care (Signed)
Immediate Anesthesia Transfer of Care Note  Patient: Natalie Harris  Procedure(s) Performed: CATARACT EXTRACTION PHACO AND INTRAOCULAR LENS PLACEMENT LEFT EYE (Left Eye)  Patient Location: Short Stay  Anesthesia Type:MAC  Level of Consciousness: awake, alert , oriented and patient cooperative  Airway & Oxygen Therapy: Patient Spontanous Breathing  Post-op Assessment: Report given to RN and Post -op Vital signs reviewed and stable  Post vital signs: Reviewed and stable  Last Vitals:  Vitals Value Taken Time  BP    Temp    Pulse    Resp    SpO2      Last Pain:  Vitals:   06/16/20 1011  TempSrc: Oral  PainSc: 0-No pain      Patients Stated Pain Goal: 7 (51/83/43 7357)  Complications: No complications documented.

## 2020-06-20 ENCOUNTER — Encounter (HOSPITAL_COMMUNITY): Payer: Self-pay | Admitting: Ophthalmology

## 2021-02-06 ENCOUNTER — Other Ambulatory Visit (HOSPITAL_COMMUNITY): Payer: Self-pay | Admitting: Internal Medicine

## 2021-02-06 DIAGNOSIS — Z1231 Encounter for screening mammogram for malignant neoplasm of breast: Secondary | ICD-10-CM

## 2021-02-21 ENCOUNTER — Ambulatory Visit (HOSPITAL_COMMUNITY)
Admission: RE | Admit: 2021-02-21 | Discharge: 2021-02-21 | Disposition: A | Discharge status: Home / Self Care | Payer: BC Managed Care – PPO | Source: Ambulatory Visit | Attending: Internal Medicine | Admitting: Internal Medicine

## 2021-02-21 ENCOUNTER — Other Ambulatory Visit: Payer: Self-pay

## 2021-02-21 DIAGNOSIS — Z1231 Encounter for screening mammogram for malignant neoplasm of breast: Secondary | ICD-10-CM | POA: Diagnosis not present

## 2021-02-23 ENCOUNTER — Ambulatory Visit (HOSPITAL_COMMUNITY): Payer: BC Managed Care – PPO

## 2022-09-08 IMAGING — MG MM DIGITAL SCREENING BILAT W/ TOMO AND CAD
6 of 10 series · 6 of 30 positions shown · non-contrast
Comparison: None.

ACR Breast Density Category a: The breast tissue is almost entirely
fatty.

CLINICAL DATA: Screening.

EXAM:
DIGITAL SCREENING BILATERAL MAMMOGRAM WITH TOMOSYNTHESIS AND CAD
TECHNIQUE: Bilateral screening digital craniocaudal and mediolateral oblique
mammograms were obtained. Bilateral screening digital breast
tomosynthesis was performed. The images were evaluated with
computer-aided detection.

[R MLO synth-2D (1 of 2)]
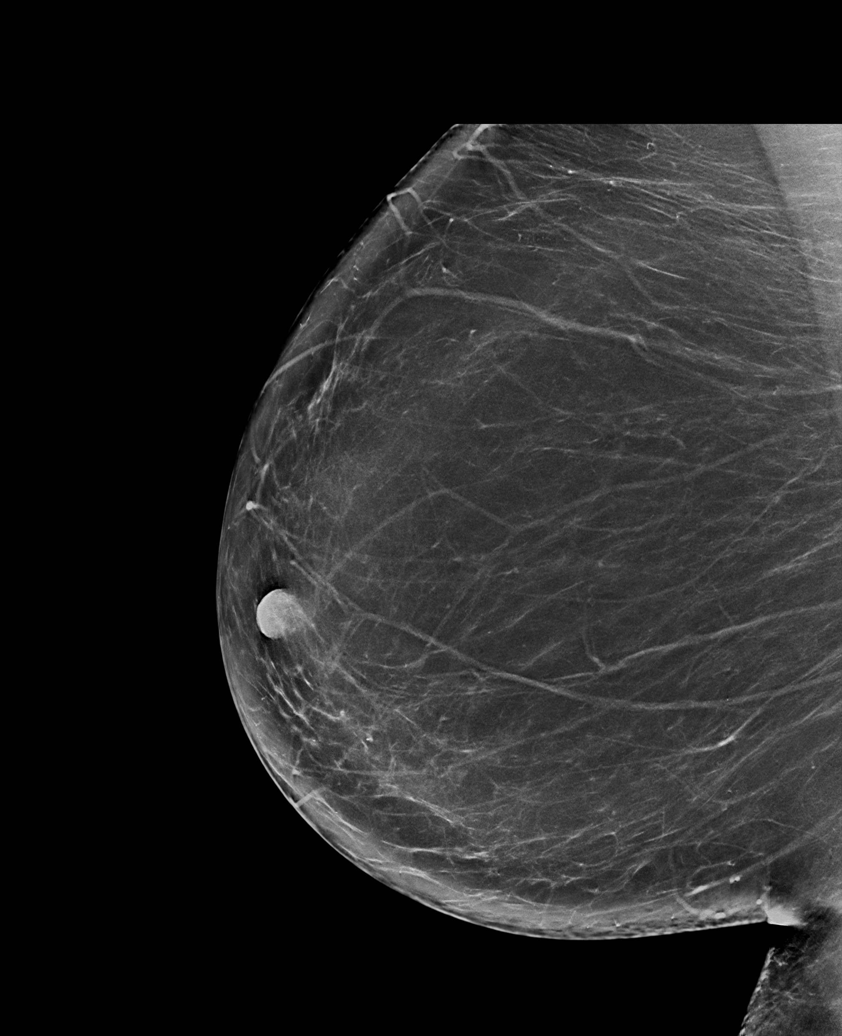

[L CC synth-2D]
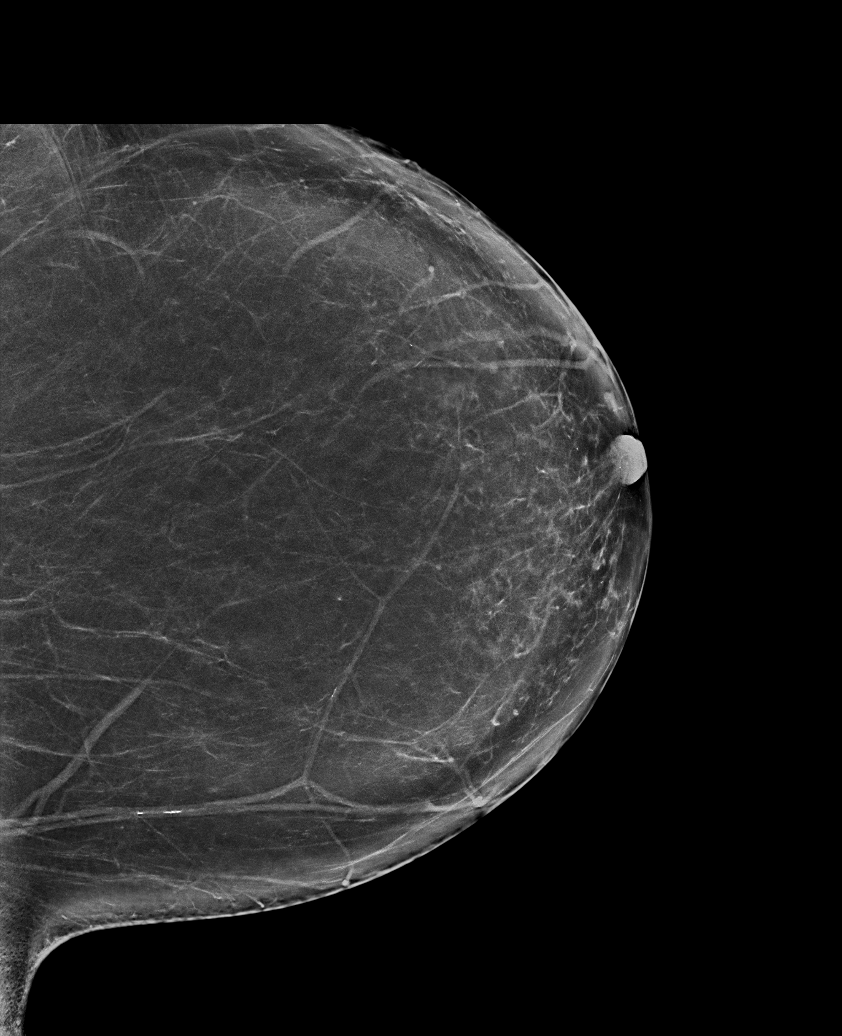

[R MLO synth-2D (2 of 2)]
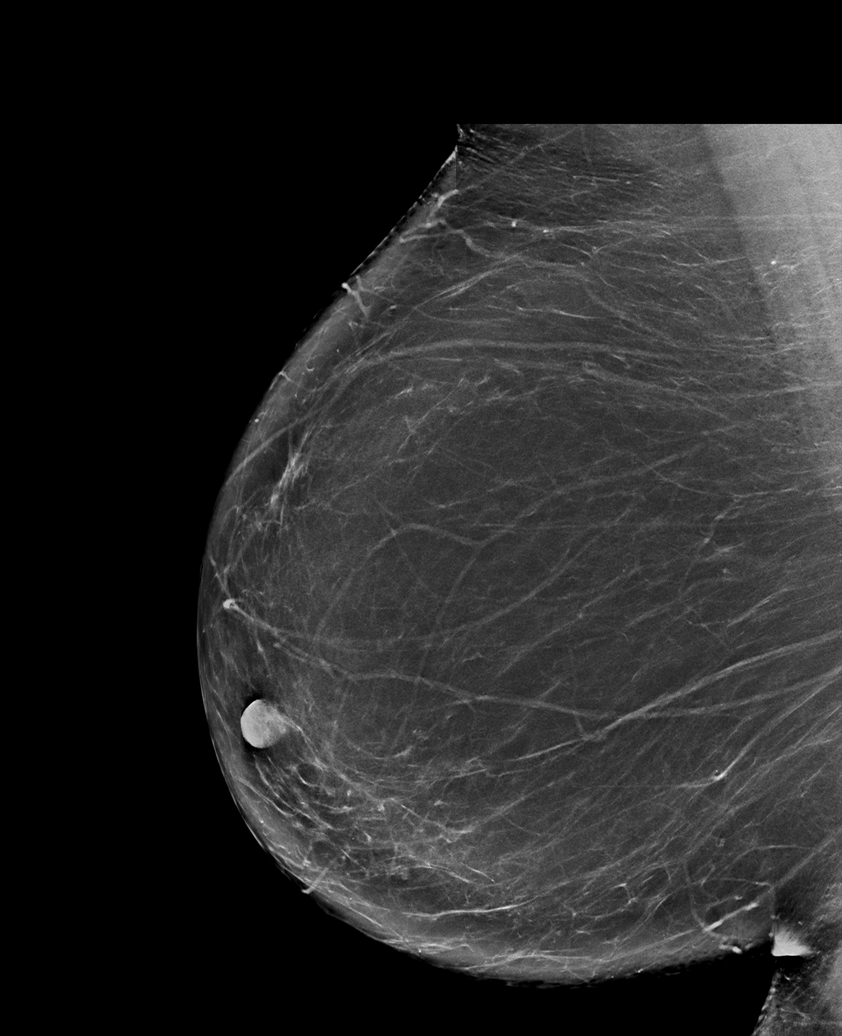

[R CC synth-2D]
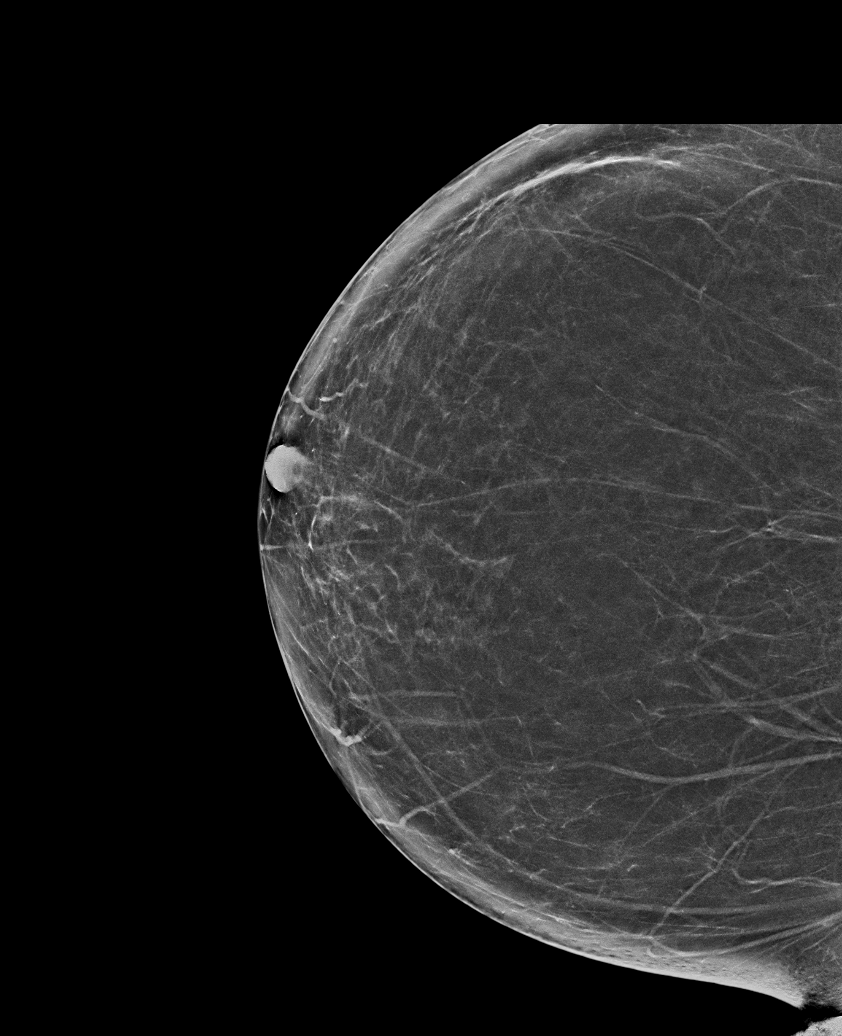

[L MLO synth-2D]
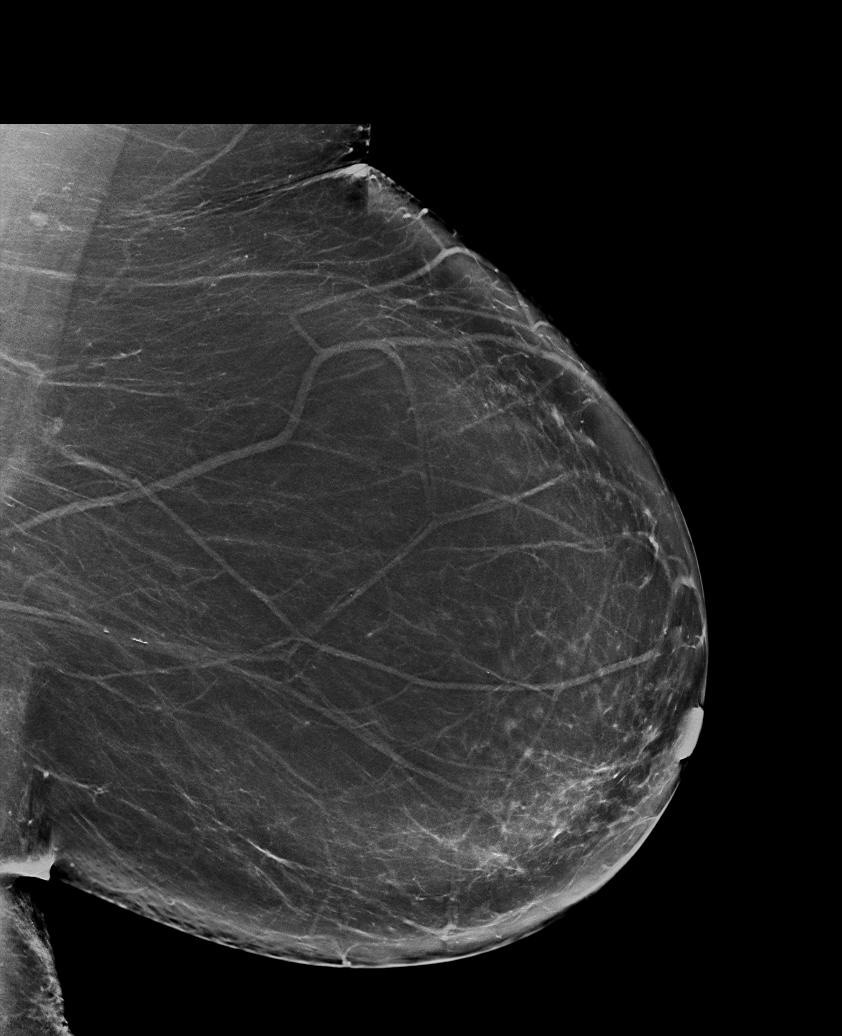

[L MLO tomo · tomo slice 45/89.0]
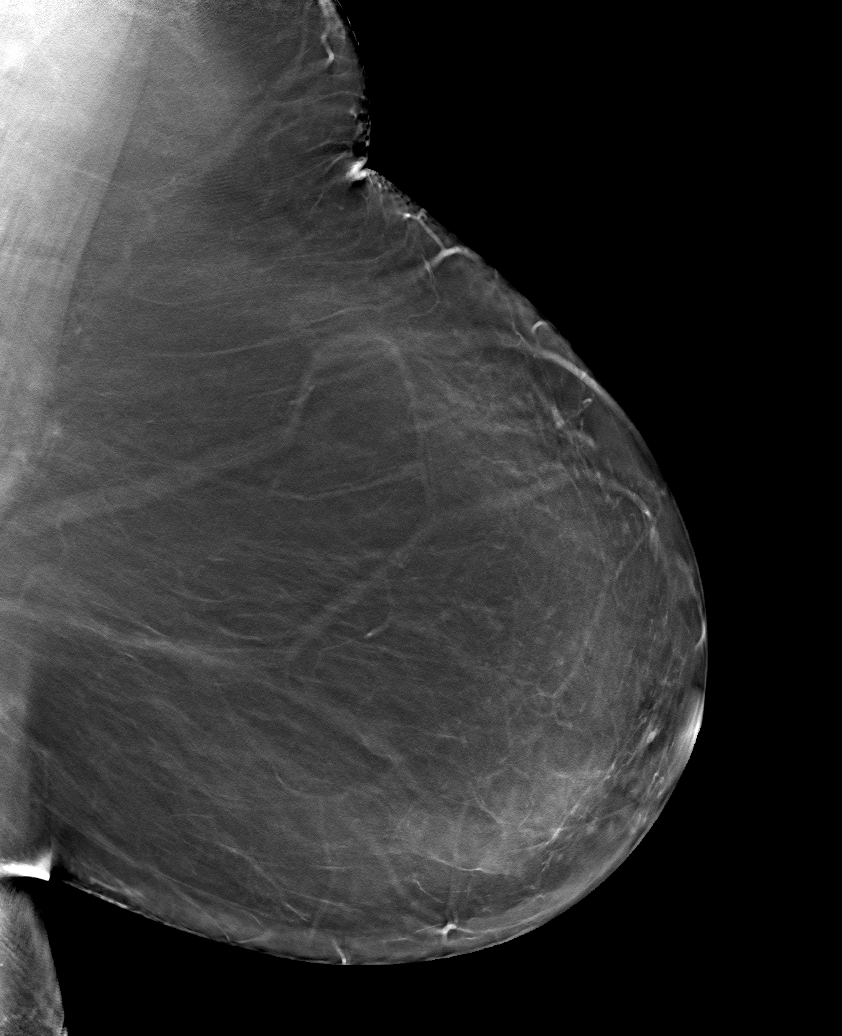

[6 of 30 positions shown; findings below may reference images not displayed]

FINDINGS: There are no findings suspicious for malignancy.
IMPRESSION: No mammographic evidence of malignancy. A result letter of this
screening mammogram will be mailed directly to the patient.

RECOMMENDATION:
Screening mammogram in one year. (Code:44-M-M6Q)

BI-RADS CATEGORY  1: Negative.

## 2023-08-13 ENCOUNTER — Ambulatory Visit
Admission: EM | Admit: 2023-08-13 | Discharge: 2023-08-13 | Disposition: A | Discharge status: Home / Self Care | Payer: BC Managed Care – PPO

## 2023-08-13 DIAGNOSIS — L0291 Cutaneous abscess, unspecified: Secondary | ICD-10-CM | POA: Diagnosis not present

## 2023-08-13 MED ORDER — CHLORHEXIDINE GLUCONATE 4 % EX SOLN: Freq: Every day | CUTANEOUS | 0 refills | Status: DC | PRN

## 2023-08-13 MED ORDER — SULFAMETHOXAZOLE-TRIMETHOPRIM 800-160 MG PO TABS: 1.0000 | ORAL_TABLET | Freq: Two times a day (BID) | ORAL | 0 refills | Status: DC

## 2023-08-13 MED ORDER — MUPIROCIN 2 % EX OINT: 1.0000 | TOPICAL_OINTMENT | Freq: Two times a day (BID) | CUTANEOUS | 0 refills | Status: AC

## 2023-08-13 NOTE — ED Triage Notes (Signed)
Pt c/o abscess and chills all day has gotten worse throughout the day, body aches, low grade fever. Abscess has been on lower abdomen x 2 days.

## 2023-08-13 NOTE — ED Provider Notes (Signed)
[facility]    CSN: [id number] Arrival date & time: 08/13/23  1634      History   Chief Complaint No chief complaint on file.   HPI Natalie Harris is a 62 y.o. female.   Patient presenting today with 2-day history of painful area to the lower abdomen that she thinks may be an abscess.  She states she started having chills, sweats and low-grade fevers at this afternoon.  Denies drainage, bleeding, known injury to the area, nausea, vomiting.  Taking ibuprofen with minimal relief.    History reviewed. No pertinent past medical history.  There are no problems to display for this patient.   Past Surgical History:  Procedure Laterality Date   ABDOMINAL HYSTERECTOMY     CATARACT EXTRACTION W/PHACO Right 06/02/2020   Procedure: CATARACT EXTRACTION PHACO AND INTRAOCULAR LENS PLACEMENT (IOC);  Surgeon: Fabio Pierce, MD;  Location: [facility];  Service: Ophthalmology;  Laterality: Right;  CDE: 7.53    CATARACT EXTRACTION W/PHACO Left 06/16/2020   Procedure: CATARACT EXTRACTION PHACO AND INTRAOCULAR LENS PLACEMENT LEFT EYE;  Surgeon: Fabio Pierce, MD;  Location: [facility];  Service: Ophthalmology;  Laterality: Left;  CDE: 5.08   EYE MUSCLE SURGERY      OB History   No obstetric history on file.      Home Medications    Prior to Admission medications   Medication Sig Start Date End Date Taking? Authorizing Provider  chlorhexidine (HIBICLENS) 4 % external liquid Apply topically daily as needed. 08/13/23  Yes Particia Nearing, PA-C  ibuprofen (ADVIL) 100 MG chewable tablet Chew 200 mg by mouth every 8 (eight) hours as needed for fever, mild pain or moderate pain.   Yes [provider]  mupirocin ointment (BACTROBAN) 2 % Apply 1 Application topically 2 (two) times daily. 08/13/23  Yes Particia Nearing, PA-C  sulfamethoxazole-trimethoprim (BACTRIM DS) 800-160 MG tablet Take 1 tablet by mouth 2 (two) times daily for 7 days. 08/13/23 08/20/23 Yes Particia Nearing, PA-C  cetirizine (ZYRTEC) 10 MG tablet Take 10 mg by mouth daily.    [provider]  cholecalciferol (VITAMIN D) 25 MCG (1000 UNIT) tablet Take 1,000 Units by mouth daily.    [provider]  lisinopril (ZESTRIL) 10 MG tablet Take 1 tablet (10 mg total) by mouth daily. 06/05/20   Wurst, [place], PA-C  naproxen sodium (ALEVE) 220 MG tablet Take 440 mg by mouth daily.    [provider]  Prednisolon-Gatiflox-Bromfenac 1-0.5-0.075 % SOLN Place 1 drop into the right eye daily.    [provider]    Family History Family History  Problem Relation Age of Onset   Hypertension Father     Social History Social History   Tobacco Use   Smoking status: Never   Smokeless tobacco: Never  Vaping Use   Vaping status: Never Used  Substance Use Topics   Drug use: Never     Allergies   Amoxicillin   Review of Systems Review of Systems Per HPI  Physical Exam Triage Vital Signs ED Triage Vitals  Encounter Vitals Group     BP 08/13/23 1640 (!) 149/72     Systolic BP Percentile --      Diastolic BP Percentile --      Pulse Rate 08/13/23 1640 96     Resp 08/13/23 1640 12     Temp 08/13/23 1640 99.9 F (37.7 C)     Temp Source 08/13/23 1640 Oral     SpO2  08/13/23 1640 95 %     Weight --      Height --      Head Circumference --      Peak Flow --      Pain Score 08/13/23 1645 7     Pain Loc --      Pain Education --      Exclude from Growth Chart --    No data found.  Updated Vital Signs BP (!) 149/72 (BP Location: Right Arm)   Pulse 96   Temp 99.9 F (37.7 C) (Oral)   Resp 12   SpO2 95%   Visual Acuity Right Eye Distance:   Left Eye Distance:   Bilateral Distance:    Right Eye Near:   Left Eye Near:    Bilateral Near:     Physical Exam Vitals and nursing note reviewed.  Constitutional:      Appearance: Normal appearance. She is not ill-appearing.  HENT:     Head: Atraumatic.     Mouth/Throat:     Mouth: Mucous  membranes are moist.     Pharynx: Oropharynx is clear.  Eyes:     Extraocular Movements: Extraocular movements intact.     Conjunctiva/sclera: Conjunctivae normal.  Cardiovascular:     Rate and Rhythm: Normal rate and regular rhythm.     Heart sounds: Normal heart sounds.  Pulmonary:     Effort: Pulmonary effort is normal.     Breath sounds: Normal breath sounds.  Musculoskeletal:        General: Normal range of motion.     Cervical back: Normal range of motion and neck supple.  Skin:    General: Skin is warm and dry.     Findings: Erythema present.     Comments: Central pustular lesion and surrounding erythema, tenderness and slight edema to the lower abdomen centrally.  No active drainage or bleeding, no fluctuance or induration  Neurological:     Mental Status: She is alert and oriented to person, place, and time.  Psychiatric:        Mood and Affect: Mood normal.        Thought Content: Thought content normal.        Judgment: Judgment normal.      UC Treatments / Results  Labs (all labs ordered are listed, but only abnormal results are displayed) Labs Reviewed - No data to display  EKG   Radiology No results found.  Procedures Procedures (including critical care time)  Medications Ordered in UC Medications - No data to display  Initial Impression / Assessment and Plan / UC Course  I have reviewed the triage vital signs and the nursing notes.  Pertinent labs & imaging results that were available during my care of the patient were reviewed by me and considered in my medical decision making (see chart for details).     Treat with Bactrim, Hibiclens, Bactroban, warm compresses, ibuprofen and Tylenol.  Return for worsening symptoms.  Final Clinical Impressions(s) / UC Diagnoses   Final diagnoses:  Abscess   Discharge Instructions   None    ED Prescriptions     Medication Sig Dispense Auth. Provider   mupirocin ointment (BACTROBAN) 2 % Apply 1  Application topically 2 (two) times daily. 22 g Particia Nearing, [place]   sulfamethoxazole-trimethoprim (BACTRIM DS) 800-160 MG tablet Take 1 tablet by mouth 2 (two) times daily for 7 days. 14 tablet Particia Nearing, [place]   chlorhexidine (HIBICLENS) 4 % external liquid Apply  topically daily as needed. 236 mL Particia Nearing, [place]      PDMP not reviewed this encounter.   Particia Nearing, [place] 08/13/23 1709

## 2023-08-15 ENCOUNTER — Ambulatory Visit
Admission: EM | Admit: 2023-08-15 | Discharge: 2023-08-15 | Disposition: A | Discharge status: Home / Self Care | Payer: BC Managed Care – PPO | Source: Home / Self Care

## 2023-08-15 DIAGNOSIS — L02211 Cutaneous abscess of abdominal wall: Secondary | ICD-10-CM

## 2023-08-15 MED ORDER — CLINDAMYCIN HCL 300 MG PO CAPS: 300.0000 mg | ORAL_CAPSULE | Freq: Three times a day (TID) | ORAL | 0 refills | Status: DC

## 2023-08-15 NOTE — ED Notes (Signed)
Site dressed with gauze and secured with perforated tape. Pt tolerated well.

## 2023-08-15 NOTE — ED Triage Notes (Signed)
Pt reports she has a bump on her vagina and a fever  x 4 days.   Took the antibiotic that the provider gave her at [facility] but states it is not getting any better.

## 2023-08-15 NOTE — ED Provider Notes (Signed)
[facility]    CSN: [id number] Arrival date & time: 08/15/23  1522      History   Chief Complaint No chief complaint on file.   HPI ALIZEA VALENSUELA is a 62 y.o. female.   The history is provided by the patient.   The patient presents for complaints of continued pain to the lower abdomen for worsening abscess.  Patient was seen in this clinic approximately 2 days ago for the same or similar symptoms.  Patient was started on Bactrim DS.  She states that she was informed to follow-up if symptoms appear to be worsening.  She states the redness to her lower abdomen is starting to spread around to her lower leg and waist.  She states she started having chills, sweats and low-grade fevers at this afternoon. Denies drainage, bleeding, known injury to the area, nausea, vomiting. Taking ibuprofen with minimal relief.   History reviewed. No pertinent past medical history.  There are no problems to display for this patient.   Past Surgical History:  Procedure Laterality Date   ABDOMINAL HYSTERECTOMY     CATARACT EXTRACTION W/PHACO Right 06/02/2020   Procedure: CATARACT EXTRACTION PHACO AND INTRAOCULAR LENS PLACEMENT (IOC);  Surgeon: Fabio Pierce, MD;  Location: [facility];  Service: Ophthalmology;  Laterality: Right;  CDE: 7.53    CATARACT EXTRACTION W/PHACO Left 06/16/2020   Procedure: CATARACT EXTRACTION PHACO AND INTRAOCULAR LENS PLACEMENT LEFT EYE;  Surgeon: Fabio Pierce, MD;  Location: [facility];  Service: Ophthalmology;  Laterality: Left;  CDE: 5.08   EYE MUSCLE SURGERY      OB History   No obstetric history on file.      Home Medications    Prior to Admission medications   Medication Sig Start Date End Date Taking? Authorizing Provider  clindamycin (CLEOCIN) 300 MG capsule Take 1 capsule (300 mg total) by mouth 3 (three) times daily for 7 days. 08/15/23 08/22/23 Yes Nya Monds-Warren, Sadie Haber, NP  cetirizine (ZYRTEC) 10 MG tablet Take 10 mg by mouth daily.    [provider]  chlorhexidine (HIBICLENS) 4 % external liquid Apply topically daily as needed. 08/13/23   Particia Nearing, PA-C  cholecalciferol (VITAMIN D) 25 MCG (1000 UNIT) tablet Take 1,000 Units by mouth daily.    [provider]  ibuprofen (ADVIL) 100 MG chewable tablet Chew 200 mg by mouth every 8 (eight) hours as needed for fever, mild pain or moderate pain.    [provider]  lisinopril (ZESTRIL) 10 MG tablet Take 1 tablet (10 mg total) by mouth daily. 06/05/20   Wurst, [place], PA-C  mupirocin ointment (BACTROBAN) 2 % Apply 1 Application topically 2 (two) times daily. 08/13/23   Particia Nearing, PA-C  naproxen sodium (ALEVE) 220 MG tablet Take 440 mg by mouth daily.    [provider]  Prednisolon-Gatiflox-Bromfenac 1-0.5-0.075 % SOLN Place 1 drop into the right eye daily.    [provider]  sulfamethoxazole-trimethoprim (BACTRIM DS) 800-160 MG tablet Take 1 tablet by mouth 2 (two) times daily for 7 days. 08/13/23 08/20/23  Particia Nearing, PA-C    Family History Family History  Problem Relation Age of Onset   Hypertension Father     Social History Social History   Tobacco Use   Smoking status: Never   Smokeless tobacco: Never  Vaping Use   Vaping status: Never Used  Substance Use Topics   Drug use: Never     Allergies   Amoxicillin   Review of  Systems Review of Systems Per HPI  Physical Exam Triage Vital Signs ED Triage Vitals  Encounter Vitals Group     BP 08/15/23 1557 107/67     Systolic BP Percentile --      Diastolic BP Percentile --      Pulse Rate 08/15/23 1557 83     Resp 08/15/23 1557 18     Temp 08/15/23 1557 98.1 F (36.7 C)     Temp Source 08/15/23 1557 Oral     SpO2 08/15/23 1557 96 %     Weight --      Height --      Head Circumference --      Peak Flow --      Pain Score 08/15/23 1556 6     Pain Loc --      Pain Education --      Exclude from Growth Chart --    No data  found.  Updated Vital Signs BP 107/67 (BP Location: Right Arm)   Pulse 83   Temp 98.1 F (36.7 C) (Oral)   Resp 18   SpO2 96%   Visual Acuity Right Eye Distance:   Left Eye Distance:   Bilateral Distance:    Right Eye Near:   Left Eye Near:    Bilateral Near:     Physical Exam Vitals and nursing note reviewed.  Constitutional:      General: She is not in acute distress.    Appearance: Normal appearance.  HENT:     Head: Normocephalic.  Eyes:     Extraocular Movements: Extraocular movements intact.     Pupils: Pupils are equal, round, and reactive to light.  Cardiovascular:     Rate and Rhythm: Normal rate and regular rhythm.  Pulmonary:     Effort: Pulmonary effort is normal.     Breath sounds: Normal breath sounds.  Abdominal:     General: Bowel sounds are normal.     Palpations: Abdomen is soft.  Musculoskeletal:     Cervical back: Normal range of motion.  Lymphadenopathy:     Cervical: No cervical adenopathy.  Skin:    General: Skin is warm and dry.     Comments: Lower abdomen is erythematous with areas of induration and fluctuance noted to the right lower quadrant.  Neurological:     General: No focal deficit present.     Mental Status: She is alert and oriented to person, place, and time.  Psychiatric:        Mood and Affect: Mood normal.        Behavior: Behavior normal.      UC Treatments / Results  Labs (all labs ordered are listed, but only abnormal results are displayed) Labs Reviewed - No data to display  EKG   Radiology No results found.  Procedures Incision and Drainage  Date/Time: 08/15/2023 4:50 PM  Performed by: Abran Cantor, NP Authorized by: Abran Cantor, NP   Consent:    Consent obtained:  Verbal   Consent given by:  Patient   Risks discussed:  Bleeding, incomplete drainage, infection and pain Universal protocol:    Procedure explained and questions answered to patient or proxy's satisfaction: yes      Patient identity confirmed:  Verbally with patient Location:    Type:  Abscess   Location: lower pelvic region. Pre-procedure details:    Procedure prep: HIbiclens and sterile water. Anesthesia:    Anesthesia method:  Local infiltration   Local anesthetic:  Lidocaine  2% WITH epi Procedure type:    Complexity:  Simple Procedure details:    Incision types:  Stab incision   Drainage:  Bloody   Drainage amount:  Moderate   Packing materials:  1/2 in iodoform gauze   Amount 1/2" iodoform:  6" Post-procedure details:    Procedure completion:  Tolerated well, no immediate complications Comments:     Abscess noted to the  lower abdomen.  Area was cleansed with sterile water and Hibiclens soap.  Area anesthetized with 10 mL of lidocaine 2% with epi.  Stab incision made with #11 blade.  Moderate sanguinous drainage return.  Area was packed with 6 inches of half-inch iodoform gauze.  Dressing was applied.  Patient tolerated well.  (including critical care time)  Medications Ordered in UC Medications - No data to display  Initial Impression / Assessment and Plan / UC Course  I have reviewed the triage vital signs and the nursing notes.  Pertinent labs & imaging results that were available during my care of the patient were reviewed by me and considered in my medical decision making (see chart for details).  The patient is well-appearing, she is in no acute distress, vital signs are stable.  Patient with abscess noted to the lower abdomen.  I&D performed with return of moderate sanguinous drainage.  Patient advised to stop Bactrim, will start patient on clindamycin 300 mg.  Supportive care recommendations were provided and discussed with the patient to include cleansing the area with an antibacterial soap, warm compresses, and over-the-counter analgesics.  Patient was advised to follow-up in the emergency department immediately if she experiences worsening redness, pain, fever, or other  concerns.  Otherwise, would like patient to follow-up in this clinic in 48 hours for reevaluation.  Patient is in agreement with this plan of care and verbalizes understanding.  All questions were answered.  Patient stable for discharge.   Final Clinical Impressions(s) / UC Diagnoses   Final diagnoses:  Abscess of skin of abdomen     Discharge Instructions      Discontinue the Bactrim and begin clindamycin 300 mg. Keep the dressing in place for the next 24 hours. As discussed, continue to apply warm compresses to the affected area at least 3-4 times daily. Cleanse the area with warm water daily and replace dressing at least twice daily. Go to the emergency department immediately if you experience fever, chills, increasing redness, drainage, foul-smelling drainage from the site, or other concerns. Please follow-up in this clinic on 8/19 for reevaluation. Follow-up as needed.     ED Prescriptions     Medication Sig Dispense Auth. Provider   clindamycin (CLEOCIN) 300 MG capsule Take 1 capsule (300 mg total) by mouth 3 (three) times daily for 7 days. 21 capsule Etrulia Zarr-Warren, Sadie Haber, NP      PDMP not reviewed this encounter.   Abran Cantor, NP 08/15/23 2005

## 2023-08-15 NOTE — Discharge Instructions (Signed)
Discontinue the Bactrim and begin clindamycin 300 mg. Keep the dressing in place for the next 24 hours. As discussed, continue to apply warm compresses to the affected area at least 3-4 times daily. Cleanse the area with warm water daily and replace dressing at least twice daily. Go to the emergency department immediately if you experience fever, chills, increasing redness, drainage, foul-smelling drainage from the site, or other concerns. Please follow-up in this clinic on 8/19 for reevaluation. Follow-up as needed.

## 2023-08-17 ENCOUNTER — Ambulatory Visit
Admission: EM | Admit: 2023-08-17 | Discharge: 2023-08-17 | Disposition: A | Discharge status: Home / Self Care | Payer: BC Managed Care – PPO | Attending: Nurse Practitioner | Admitting: Nurse Practitioner

## 2023-08-17 VITALS — BP 129/77 | HR 74 | Temp 97.7°F | Resp 18

## 2023-08-17 DIAGNOSIS — L02211 Cutaneous abscess of abdominal wall: Secondary | ICD-10-CM | POA: Diagnosis not present

## 2023-08-17 NOTE — Discharge Instructions (Addendum)
I would like for you to follow-up in this clinic on 8/20 for removal of the packing that is currently in place.  Continue the antibiotics that you are currently taking.  Continue warm compresses and over-the-counter Tylenol or ibuprofen as needed for pain or discomfort. Go to the emergency department immediately if you experience fever, chills, increasing redness, drainage, foul-smelling drainage from the site, or other concerns.

## 2023-08-17 NOTE — ED Provider Notes (Addendum)
[facility]    CSN: [id number] Arrival date & time: 08/17/23  1301      History   Chief Complaint No chief complaint on file.   HPI Natalie Harris is a 62 y.o. female.   The history is provided by the patient.   Patient presents for follow-up for an abscess of the lower abdomen.  Patient was seen in this clinic on 08/15/2023, I&D was performed, with packing left in place.  Patient was then switched to clindamycin for continued treatment.  Patient reports today for follow-up.  She denies fever, chills, chest pain, abdominal pain, nausea, vomiting, or diarrhea.  She reports that the area has continued to drain.  She states that she feels that the redness along her lower abdomen has decreased, but she is continue to experience pain.  She reports she has been taking Tylenol for her pain control.  She has also been taking the clindamycin as prescribed.  History reviewed. No pertinent past medical history.  There are no problems to display for this patient.   Past Surgical History:  Procedure Laterality Date   ABDOMINAL HYSTERECTOMY     CATARACT EXTRACTION W/PHACO Right 06/02/2020   Procedure: CATARACT EXTRACTION PHACO AND INTRAOCULAR LENS PLACEMENT (IOC);  Surgeon: Fabio Pierce, MD;  Location: [facility];  Service: Ophthalmology;  Laterality: Right;  CDE: 7.53    CATARACT EXTRACTION W/PHACO Left 06/16/2020   Procedure: CATARACT EXTRACTION PHACO AND INTRAOCULAR LENS PLACEMENT LEFT EYE;  Surgeon: Fabio Pierce, MD;  Location: [facility];  Service: Ophthalmology;  Laterality: Left;  CDE: 5.08   EYE MUSCLE SURGERY      OB History   No obstetric history on file.      Home Medications    Prior to Admission medications   Medication Sig Start Date End Date Taking? Authorizing Provider  cetirizine (ZYRTEC) 10 MG tablet Take 10 mg by mouth daily.    [provider]  chlorhexidine (HIBICLENS) 4 % external liquid Apply topically daily as needed. 08/13/23   Particia Nearing, PA-C  cholecalciferol (VITAMIN D) 25 MCG (1000 UNIT) tablet Take 1,000 Units by mouth daily.    [provider]  clindamycin (CLEOCIN) 300 MG capsule Take 1 capsule (300 mg total) by mouth 3 (three) times daily for 7 days. 08/15/23 08/22/23  Emmamarie Kluender-Warren, Sadie Haber, NP  ibuprofen (ADVIL) 100 MG chewable tablet Chew 200 mg by mouth every 8 (eight) hours as needed for fever, mild pain or moderate pain.    [provider]  lisinopril (ZESTRIL) 10 MG tablet Take 1 tablet (10 mg total) by mouth daily. 06/05/20   Wurst, [place], PA-C  mupirocin ointment (BACTROBAN) 2 % Apply 1 Application topically 2 (two) times daily. 08/13/23   Particia Nearing, PA-C  naproxen sodium (ALEVE) 220 MG tablet Take 440 mg by mouth daily.    [provider]  Prednisolon-Gatiflox-Bromfenac 1-0.5-0.075 % SOLN Place 1 drop into the right eye daily.    [provider]  sulfamethoxazole-trimethoprim (BACTRIM DS) 800-160 MG tablet Take 1 tablet by mouth 2 (two) times daily for 7 days. 08/13/23 08/20/23  Particia Nearing, PA-C    Family History Family History  Problem Relation Age of Onset   Hypertension Father     Social History Social History   Tobacco Use   Smoking status: Never   Smokeless tobacco: Never  Vaping Use   Vaping status: Never Used  Substance Use Topics   Drug use: Never     Allergies  Amoxicillin   Review of Systems Review of Systems Per HPI  Physical Exam Triage Vital Signs ED Triage Vitals  Encounter Vitals Group     BP 08/17/23 1326 129/77     Systolic BP Percentile --      Diastolic BP Percentile --      Pulse Rate 08/17/23 1326 74     Resp 08/17/23 1326 18     Temp 08/17/23 1326 97.7 F (36.5 C)     Temp Source 08/17/23 1326 Oral     SpO2 08/17/23 1326 97 %     Weight --      Height --      Head Circumference --      Peak Flow --      Pain Score 08/17/23 1327 4     Pain Loc --      Pain Education --      Exclude from  Growth Chart --    No data found.  Updated Vital Signs BP 129/77 (BP Location: Right Arm)   Pulse 74   Temp 97.7 F (36.5 C) (Oral)   Resp 18   SpO2 97%   Visual Acuity Right Eye Distance:   Left Eye Distance:   Bilateral Distance:    Right Eye Near:   Left Eye Near:    Bilateral Near:     Physical Exam Vitals and nursing note reviewed.  Constitutional:      General: She is not in acute distress.    Appearance: Normal appearance.  HENT:     Head: Normocephalic.  Eyes:     Extraocular Movements: Extraocular movements intact.     Pupils: Pupils are equal, round, and reactive to light.  Cardiovascular:     Rate and Rhythm: Normal rate and regular rhythm.     Pulses: Normal pulses.     Heart sounds: Normal heart sounds.  Pulmonary:     Effort: Pulmonary effort is normal. No respiratory distress.     Breath sounds: Normal breath sounds. No stridor. No wheezing, rhonchi or rales.  Abdominal:     General: Bowel sounds are normal.     Palpations: Abdomen is soft.  Musculoskeletal:     Cervical back: Normal range of motion.  Skin:    General: Skin is warm and dry.     Findings: Abscess present.     Comments: Continued erythema noted to the lower abdomen.  In the right lower quadrant, area of erythema is firm, warm, and tender to palpation.  Packing is in place.  There is no foul-smelling drainage present at this time.  Neurological:     General: No focal deficit present.     Mental Status: She is alert and oriented to person, place, and time.  Psychiatric:        Mood and Affect: Mood normal.        Behavior: Behavior normal.           UC Treatments / Results  Labs (all labs ordered are listed, but only abnormal results are displayed) Labs Reviewed - No data to display  EKG   Radiology No results found.  Procedures Incision and Drainage  Date/Time: 08/17/2023 2:26 PM  Performed by: Abran Cantor, NP Authorized by: Abran Cantor, NP   Consent:    Consent obtained:  Verbal   Consent given by:  Patient Universal protocol:    Patient identity confirmed:  Verbally with patient Location:    Type:  Abscess   Location:  lower abdominal wall. Pre-procedure details:    Procedure prep: Hibiclens and sterile water. Sedation:    Sedation type:  None Anesthesia:    Anesthesia method:  Local infiltration   Local anesthetic:  Lidocaine 2% WITH epi (5mL) Procedure details:    Drainage:  Serosanguinous   Packing material: 1" iodoform gauze. Post-procedure details:    Procedure completion:  Tolerated Comments:     Remove packing placed on 8/16 from incision made from I&D.  Lower abdomen remains erythematous, with firmness noted to the site.  Replaced iodoform gauze with 1 inch iodoform gauze to the site.  There is some gaping noted to the area where the incision was made on 8/16.  Patient tolerated well.  (including critical care time)  Medications Ordered in UC Medications - No data to display  Initial Impression / Assessment and Plan / UC Course  I have reviewed the triage vital signs and the nursing notes.  Pertinent labs & imaging results that were available during my care of the patient were reviewed by me and considered in my medical decision making (see chart for details).  Patient presents for follow-up for recheck for a lower abdominal wall abscess.  There is some improvement with regard to erythema noted along the lower abdomen.  The area does remain tender to palpation.  Packing was removed and replaced at today's visit.  Patient is continuing clindamycin at this time.  There is a small area of gaping to the lower abdomen, consistent with where the incision was placed for the I&D that performed on 08/15/2023.  1 inch packing placed to the site today.  Will have patient follow-up in 48 hours for recheck and for removal of the packing if appropriate.  Patient was given strict ER follow-up precautions.  Patient is in  agreement with this plan of care and verbalizes understanding.  All questions were answered.  Patient stable for discharge.   Final Clinical Impressions(s) / UC Diagnoses   Final diagnoses:  Abscess of abdominal wall     Discharge Instructions      I would like for you to follow-up in this clinic on 8/20 for removal of the packing that is currently in place.  Continue the antibiotics that you are currently taking.  Continue warm compresses and over-the-counter Tylenol or ibuprofen as needed for pain or discomfort. Go to the emergency department immediately if you experience fever, chills, increasing redness, drainage, foul-smelling drainage from the site, or other concerns.      ED Prescriptions   None    PDMP not reviewed this encounter.   Abran Cantor, NP 08/17/23 1550    Abran Cantor, NP 08/17/23 1556

## 2023-08-17 NOTE — ED Triage Notes (Signed)
Pt reports the abscess in the vaginal area is now draining x 2 days.

## 2023-08-19 ENCOUNTER — Ambulatory Visit
Admission: RE | Admit: 2023-08-19 | Discharge: 2023-08-19 | Disposition: A | Discharge status: Home / Self Care | Payer: BC Managed Care – PPO | Source: Ambulatory Visit | Attending: Internal Medicine | Admitting: Internal Medicine

## 2023-08-19 VITALS — BP 114/71 | HR 88 | Temp 98.2°F | Resp 12

## 2023-08-19 DIAGNOSIS — L0291 Cutaneous abscess, unspecified: Secondary | ICD-10-CM

## 2023-08-19 NOTE — ED Provider Notes (Signed)
[facility]    CSN: [id number] Arrival date & time: 08/19/23  1513      History   Chief Complaint No chief complaint on file.   HPI Natalie Harris is a 62 y.o. female.   Presenting today following up on right lower abdominal abscess.  Had I&D performed 08/15/2023 and has had packing changes twice now.  Has been on clindamycin and states the area is slowly looking like it is improving.  Her pain is a bit less and after the packing fell out earlier today she has not had significant drainage from the area.  Denies fever, chills, body aches, nausea, fatigue.    History reviewed. No pertinent past medical history.  There are no problems to display for this patient.   Past Surgical History:  Procedure Laterality Date   ABDOMINAL HYSTERECTOMY     CATARACT EXTRACTION W/PHACO Right 06/02/2020   Procedure: CATARACT EXTRACTION PHACO AND INTRAOCULAR LENS PLACEMENT (IOC);  Surgeon: Fabio Pierce, MD;  Location: [facility];  Service: Ophthalmology;  Laterality: Right;  CDE: 7.53    CATARACT EXTRACTION W/PHACO Left 06/16/2020   Procedure: CATARACT EXTRACTION PHACO AND INTRAOCULAR LENS PLACEMENT LEFT EYE;  Surgeon: Fabio Pierce, MD;  Location: [facility];  Service: Ophthalmology;  Laterality: Left;  CDE: 5.08   EYE MUSCLE SURGERY      OB History   No obstetric history on file.      Home Medications    Prior to Admission medications   Medication Sig Start Date End Date Taking? Authorizing Provider  cetirizine (ZYRTEC) 10 MG tablet Take 10 mg by mouth daily.    [provider]  chlorhexidine (HIBICLENS) 4 % external liquid Apply topically daily as needed. 08/13/23   Particia Nearing, PA-C  cholecalciferol (VITAMIN D) 25 MCG (1000 UNIT) tablet Take 1,000 Units by mouth daily.    [provider]  clindamycin (CLEOCIN) 300 MG capsule Take 1 capsule (300 mg total) by mouth 3 (three) times daily for 7 days. 08/15/23 08/22/23  Leath-Warren, Sadie Haber, NP   ibuprofen (ADVIL) 100 MG chewable tablet Chew 200 mg by mouth every 8 (eight) hours as needed for fever, mild pain or moderate pain.    [provider]  lisinopril (ZESTRIL) 10 MG tablet Take 1 tablet (10 mg total) by mouth daily. 06/05/20   Wurst, [place], PA-C  mupirocin ointment (BACTROBAN) 2 % Apply 1 Application topically 2 (two) times daily. 08/13/23   Particia Nearing, PA-C  naproxen sodium (ALEVE) 220 MG tablet Take 440 mg by mouth daily.    [provider]  Prednisolon-Gatiflox-Bromfenac 1-0.5-0.075 % SOLN Place 1 drop into the right eye daily.    [provider]  sulfamethoxazole-trimethoprim (BACTRIM DS) 800-160 MG tablet Take 1 tablet by mouth 2 (two) times daily for 7 days. 08/13/23 08/20/23  Particia Nearing, PA-C    Family History Family History  Problem Relation Age of Onset   Hypertension Father     Social History Social History   Tobacco Use   Smoking status: Never   Smokeless tobacco: Never  Vaping Use   Vaping status: Never Used  Substance Use Topics   Drug use: Never     Allergies   Amoxicillin   Review of Systems Review of Systems Per HPI  Physical Exam Triage Vital Signs ED Triage Vitals  Encounter Vitals Group     BP 08/19/23 1527 114/71     Systolic BP Percentile --      Diastolic BP  Percentile --      Pulse Rate 08/19/23 1527 88     Resp 08/19/23 1527 12     Temp 08/19/23 1527 98.2 F (36.8 C)     Temp Source 08/19/23 1527 Oral     SpO2 08/19/23 1527 98 %     Weight --      Height --      Head Circumference --      Peak Flow --      Pain Score 08/19/23 1529 4     Pain Loc --      Pain Education --      Exclude from Growth Chart --    No data found.  Updated Vital Signs BP 114/71 (BP Location: Right Arm)   Pulse 88   Temp 98.2 F (36.8 C) (Oral)   Resp 12   SpO2 98%   Visual Acuity Right Eye Distance:   Left Eye Distance:   Bilateral Distance:    Right Eye Near:   Left Eye Near:     Bilateral Near:     Physical Exam Vitals and nursing note reviewed.  Constitutional:      Appearance: Normal appearance. She is not ill-appearing.  HENT:     Head: Atraumatic.  Eyes:     Extraocular Movements: Extraocular movements intact.     Conjunctiva/sclera: Conjunctivae normal.  Cardiovascular:     Rate and Rhythm: Normal rate and regular rhythm.     Heart sounds: Normal heart sounds.  Pulmonary:     Effort: Pulmonary effort is normal.     Breath sounds: Normal breath sounds.  Musculoskeletal:        General: Normal range of motion.     Cervical back: Normal range of motion and neck supple.  Skin:    General: Skin is warm.     Comments: Large area of erythema, edema, firmness to right lower abdomen/groin region with central opening where I&D was performed.  No active drainage or bleeding, no fluctuance, no induration  Neurological:     Mental Status: She is alert and oriented to person, place, and time.  Psychiatric:        Mood and Affect: Mood normal.        Thought Content: Thought content normal.        Judgment: Judgment normal.      UC Treatments / Results  Labs (all labs ordered are listed, but only abnormal results are displayed) Labs Reviewed - No data to display  EKG   Radiology No results found.  Procedures Procedures (including critical care time)  Medications Ordered in UC Medications - No data to display  Initial Impression / Assessment and Plan / UC Course  I have reviewed the triage vital signs and the nursing notes.  Pertinent labs & imaging results that were available during my care of the patient were reviewed by me and considered in my medical decision making (see chart for details).     Slowly improving on oral antibiotics, will forego packing placement today as it fell out spontaneously and there is no current drainage.  She plans to follow-up with her primary care provider either tomorrow or the next day.  Continue current home  wound care, antibiotic therapy.  Return precautions reviewed.  Final Clinical Impressions(s) / UC Diagnoses   Final diagnoses:  Abscess   Discharge Instructions   None    ED Prescriptions   None    PDMP not reviewed this encounter.   Roosvelt Maser  [place], [place] 08/19/23 1616

## 2023-08-19 NOTE — ED Triage Notes (Signed)
Pt c/o following up on abscess, lower right side pt states this is her 4th time she has been seen for matter. States she feels better today and is able to move a lot better.

## 2023-08-20 ENCOUNTER — Ambulatory Visit: Payer: Self-pay | Admitting: Surgery

## 2023-08-20 DIAGNOSIS — L089 Local infection of the skin and subcutaneous tissue, unspecified: Secondary | ICD-10-CM

## 2023-08-20 NOTE — H&P (View-Only) (Signed)
Jamille Savoia Piatek [id number]   Referring Provider:  Beau Fanny, MD   Subjective   Chief Complaint: New Consultation (Suprapubic abscess, I&D on 08/15/23)     History of Present Illness:    62 year old woman who is referred emergently to the surgical office by her primary care doctor with a persistent soft tissue infection of the mons.  Symptoms began on approximately 8/12 and she was seen in urgent care 8/14, started on antibiotics at that time but no procedure done.  On 8/16 she returned with ongoing symptoms and had an I&D at urgent care.  She has been back to urgent care 2 more times- on the 18th and 20th- where ongoing but improving cellulitis was noted and no active drainage.  No imaging or lab work has been done.  Photos in epic from 8/18 reviewed. Has been on Bactrim followed by clindamycin.  Despite this, there is persistent cellulitis, pain and tenderness extending onto the right lower abdominal wall.  She followed up with her primary care doctor today (21st) who directed her to our office stating that she needed to be seen by a surgeon today.  While she did have a low-grade fever at the beginning of this episode, she has been afebrile for the last several days.  Aside from fairly severe pain in the region of the infection, she feels otherwise well but has missed work for the last week or so (works at [employer] at [employer]).   Review of Systems: A complete review of systems was obtained from the patient.  I have reviewed this information and discussed as appropriate with the patient.  See HPI as well for other ROS.   Medical History: Past Medical History:  Diagnosis Date   Anemia     There is no problem list on file for this patient.   Past Surgical History:  Procedure Laterality Date   CATARACT EXTRACTION     HYSTERECTOMY       Allergies  Allergen Reactions   Amoxicillin Hives and Rash    Current Outpatient Medications on File Prior to Visit   Medication Sig Dispense Refill   cetirizine (ZYRTEC) 10 MG tablet Take 10 mg by mouth once daily     ibuprofen 100 mg Chew Take by mouth     lisinopriL (ZESTRIL) 40 MG tablet      sulfamethoxazole-trimethoprim (BACTRIM DS) 800-160 mg tablet Take 1 tablet by mouth 2 (two) times daily     No current facility-administered medications on file prior to visit.    Family History  Problem Relation Age of Onset   Skin cancer Mother    Coronary Artery Disease (Blocked arteries around heart) Mother    Diabetes Father      Social History   Tobacco Use  Smoking Status Never  Smokeless Tobacco Never     Social History   Socioeconomic History   Marital status: Married  Tobacco Use   Smoking status: Never   Smokeless tobacco: Never  Vaping Use   Vaping status: Never Used  Substance and Sexual Activity   Alcohol use: Defer   Drug use: Never    Objective:    Vitals:   08/20/23 1357  BP: 126/72  Pulse: 83  Temp: 36.8 C (98.2 F)  SpO2: (!) 85%  Weight: 99 kg (218 lb 3.2 oz)  Height: 165.1 cm (5\' 5" )  PainSc:   4  PainLoc: Other (Comment)    Body mass index is 36.31 kg/m.  Gen: A&Ox3,  no distress  Unlabored respirations Cellulitis appears fairly stable compared to the photograph from 3 days ago.  No overt crepitus.  Abscess cavity tracks laterally at least 10 cm and there is tenderness and induration along the right lower abdominal wall without overlying erythema  Assessment and Plan:  Diagnoses and all orders for this visit:  Soft tissue infection  She does not appear to be toxic from this at this point but I do think it would benefit from wider drainage.  I have discussed with my partner who is on-call at the hospital for the acute care surgical service and he has kindly agreed to do this tomorrow in the OR.  I discussed with the patient that depending on intraoperative findings, she may need to be admitted for IV antibiotics and additional debridements, versus  discharge home from recovery with wet-to-dry dressings.   Kazi Montoro Carlye Grippe, MD

## 2023-08-20 NOTE — Progress Notes (Signed)
 Attempted to obtain medical history via telephone, unable to reach at this time. Left pre op instructions on voicemail.

## 2023-08-20 NOTE — H&P (Signed)
Natalie Harris [id number]   Referring Provider:  Beau Fanny, MD   Subjective   Chief Complaint: New Consultation (Suprapubic abscess, I&D on 08/15/23)     History of Present Illness:    62 year old woman who is referred emergently to the surgical office by her primary care doctor with a persistent soft tissue infection of the mons.  Symptoms began on approximately 8/12 and she was seen in urgent care 8/14, started on antibiotics at that time but no procedure done.  On 8/16 she returned with ongoing symptoms and had an I&D at urgent care.  She has been back to urgent care 2 more times- on the 18th and 20th- where ongoing but improving cellulitis was noted and no active drainage.  No imaging or lab work has been done.  Photos in epic from 8/18 reviewed. Has been on Bactrim followed by clindamycin.  Despite this, there is persistent cellulitis, pain and tenderness extending onto the right lower abdominal wall.  She followed up with her primary care doctor today (21st) who directed her to our office stating that she needed to be seen by a surgeon today.  While she did have a low-grade fever at the beginning of this episode, she has been afebrile for the last several days.  Aside from fairly severe pain in the region of the infection, she feels otherwise well but has missed work for the last week or so (works at [employer] at [employer]).   Review of Systems: A complete review of systems was obtained from the patient.  I have reviewed this information and discussed as appropriate with the patient.  See HPI as well for other ROS.   Medical History: Past Medical History:  Diagnosis Date   Anemia     There is no problem list on file for this patient.   Past Surgical History:  Procedure Laterality Date   CATARACT EXTRACTION     HYSTERECTOMY       Allergies  Allergen Reactions   Amoxicillin Hives and Rash    Current Outpatient Medications on File Prior to Visit   Medication Sig Dispense Refill   cetirizine (ZYRTEC) 10 MG tablet Take 10 mg by mouth once daily     ibuprofen 100 mg Chew Take by mouth     lisinopriL (ZESTRIL) 40 MG tablet      sulfamethoxazole-trimethoprim (BACTRIM DS) 800-160 mg tablet Take 1 tablet by mouth 2 (two) times daily     No current facility-administered medications on file prior to visit.    Family History  Problem Relation Age of Onset   Skin cancer Mother    Coronary Artery Disease (Blocked arteries around heart) Mother    Diabetes Father      Social History   Tobacco Use  Smoking Status Never  Smokeless Tobacco Never     Social History   Socioeconomic History   Marital status: Married  Tobacco Use   Smoking status: Never   Smokeless tobacco: Never  Vaping Use   Vaping status: Never Used  Substance and Sexual Activity   Alcohol use: Defer   Drug use: Never    Objective:    Vitals:   08/20/23 1357  BP: 126/72  Pulse: 83  Temp: 36.8 C (98.2 F)  SpO2: (!) 85%  Weight: 99 kg (218 lb 3.2 oz)  Height: 165.1 cm (5\' 5" )  PainSc:   4  PainLoc: Other (Comment)    Body mass index is 36.31 kg/m.  Gen: A&Ox3,  no distress  Unlabored respirations Cellulitis appears fairly stable compared to the photograph from 3 days ago.  No overt crepitus.  Abscess cavity tracks laterally at least 10 cm and there is tenderness and induration along the right lower abdominal wall without overlying erythema  Assessment and Plan:  Diagnoses and all orders for this visit:  Soft tissue infection  She does not appear to be toxic from this at this point but I do think it would benefit from wider drainage.  I have discussed with my partner who is on-call at the hospital for the acute care surgical service and he has kindly agreed to do this tomorrow in the OR.  I discussed with the patient that depending on intraoperative findings, she may need to be admitted for IV antibiotics and additional debridements, versus  discharge home from recovery with wet-to-dry dressings.   Kazi Montoro Carlye Grippe, MD

## 2023-08-21 ENCOUNTER — Other Ambulatory Visit: Payer: Self-pay

## 2023-08-21 ENCOUNTER — Encounter (HOSPITAL_COMMUNITY): Admission: RE | Disposition: A | Discharge status: Home / Self Care | Payer: Self-pay | Source: Home / Self Care

## 2023-08-21 ENCOUNTER — Ambulatory Visit (HOSPITAL_COMMUNITY): Payer: BC Managed Care – PPO | Admitting: Anesthesiology

## 2023-08-21 ENCOUNTER — Encounter (HOSPITAL_COMMUNITY): Payer: Self-pay | Admitting: Surgery

## 2023-08-21 ENCOUNTER — Observation Stay (HOSPITAL_COMMUNITY)
Admission: RE | Admit: 2023-08-21 | Discharge: 2023-08-22 | Disposition: A | Discharge status: Home / Self Care | Payer: BC Managed Care – PPO | Attending: Surgery | Admitting: Surgery

## 2023-08-21 DIAGNOSIS — L02214 Cutaneous abscess of groin: Principal | ICD-10-CM | POA: Diagnosis present

## 2023-08-21 DIAGNOSIS — I10 Essential (primary) hypertension: Principal | ICD-10-CM

## 2023-08-21 DIAGNOSIS — L089 Local infection of the skin and subcutaneous tissue, unspecified: Secondary | ICD-10-CM

## 2023-08-21 HISTORY — DX: Essential (primary) hypertension: I10

## 2023-08-21 HISTORY — PX: INCISION AND DRAINAGE ABSCESS: SHX5864

## 2023-08-21 LAB — CBC WITH DIFFERENTIAL/PLATELET
Abs Immature Granulocytes: 0.29 10*3/uL — ABNORMAL HIGH (ref 0.00–0.07)
Basophils Absolute: 0.1 10*3/uL (ref 0.0–0.1)
Basophils Relative: 2 %
Eosinophils Absolute: 0.3 10*3/uL (ref 0.0–0.5)
Eosinophils Relative: 3 %
HCT: 38.6 % (ref 36.0–46.0)
Hemoglobin: 13.7 g/dL (ref 12.0–15.0)
Immature Granulocytes: 4 %
Lymphocytes Relative: 19 %
Lymphs Abs: 1.5 10*3/uL (ref 0.7–4.0)
MCH: 31.2 pg (ref 26.0–34.0)
MCHC: 35.5 g/dL (ref 30.0–36.0)
MCV: 87.9 fL (ref 80.0–100.0)
Monocytes Absolute: 1.1 10*3/uL — ABNORMAL HIGH (ref 0.1–1.0)
Monocytes Relative: 15 %
Neutro Abs: 4.5 10*3/uL (ref 1.7–7.7)
Neutrophils Relative %: 57 %
Platelets: 407 10*3/uL — ABNORMAL HIGH (ref 150–400)
RBC: 4.39 MIL/uL (ref 3.87–5.11)
RDW: 12.3 % (ref 11.5–15.5)
WBC: 7.8 10*3/uL (ref 4.0–10.5)
nRBC: 0 % (ref 0.0–0.2)

## 2023-08-21 LAB — BASIC METABOLIC PANEL
Anion gap: 12 (ref 5–15)
BUN: 38 mg/dL — ABNORMAL HIGH (ref 8–23)
CO2: 22 mmol/L (ref 22–32)
Calcium: 10.5 mg/dL — ABNORMAL HIGH (ref 8.9–10.3)
Chloride: 96 mmol/L — ABNORMAL LOW (ref 98–111)
Creatinine, Ser: 1.45 mg/dL — ABNORMAL HIGH (ref 0.44–1.00)
GFR, Estimated: 40 mL/min — ABNORMAL LOW (ref >60)
Glucose, Bld: 116 mg/dL — ABNORMAL HIGH (ref 70–99)
Potassium: 2.8 mmol/L — ABNORMAL LOW (ref 3.5–5.1)
Sodium: 130 mmol/L — ABNORMAL LOW (ref 135–145)

## 2023-08-21 SURGERY — INCISION AND DRAINAGE, ABSCESS
Anesthesia: General

## 2023-08-21 MED ORDER — MIDAZOLAM HCL 5 MG/5ML IJ SOLN
INTRAMUSCULAR | Status: DC | PRN
  Administered 2023-08-21: 1 mg via INTRAVENOUS

## 2023-08-21 MED ORDER — LACTATED RINGERS IV SOLN: INTRAVENOUS | Status: DC

## 2023-08-21 MED ORDER — DIPHENHYDRAMINE HCL 25 MG PO CAPS: 25.0000 mg | ORAL_CAPSULE | Freq: Four times a day (QID) | ORAL | Status: DC | PRN

## 2023-08-21 MED ORDER — ACETAMINOPHEN 650 MG RE SUPP: 650.0000 mg | Freq: Four times a day (QID) | RECTAL | Status: DC | PRN

## 2023-08-21 MED ORDER — PROPOFOL 10 MG/ML IV BOLUS
INTRAVENOUS | Status: AC
  Filled 2023-08-21: qty 20

## 2023-08-21 MED ORDER — FENTANYL CITRATE (PF) 100 MCG/2ML IJ SOLN
INTRAMUSCULAR | Status: DC | PRN
  Administered 2023-08-21 (×2): 50 ug via INTRAVENOUS

## 2023-08-21 MED ORDER — LORATADINE 10 MG PO TABS
10.0000 mg | ORAL_TABLET | Freq: Every day | ORAL | Status: DC
  Administered 2023-08-22: 10 mg via ORAL
  Filled 2023-08-21: qty 1

## 2023-08-21 MED ORDER — 0.9 % SODIUM CHLORIDE (POUR BTL) OPTIME
TOPICAL | Status: DC | PRN
  Administered 2023-08-21: 1000 mL

## 2023-08-21 MED ORDER — CHLORHEXIDINE GLUCONATE 0.12 % MT SOLN
15.0000 mL | Freq: Once | OROMUCOSAL | Status: AC
  Administered 2023-08-21: 15 mL via OROMUCOSAL

## 2023-08-21 MED ORDER — ORAL CARE MOUTH RINSE: 15.0000 mL | Freq: Once | OROMUCOSAL | Status: AC

## 2023-08-21 MED ORDER — ACETAMINOPHEN 500 MG PO TABS
1000.0000 mg | ORAL_TABLET | ORAL | Status: AC
  Administered 2023-08-21: 1000 mg via ORAL
  Filled 2023-08-21: qty 2

## 2023-08-21 MED ORDER — TRAMADOL HCL 50 MG PO TABS: 50.0000 mg | ORAL_TABLET | Freq: Four times a day (QID) | ORAL | Status: DC | PRN

## 2023-08-21 MED ORDER — MORPHINE SULFATE (PF) 4 MG/ML IV SOLN: 4.0000 mg | INTRAVENOUS | Status: DC | PRN

## 2023-08-21 MED ORDER — MIDAZOLAM HCL 2 MG/2ML IJ SOLN
INTRAMUSCULAR | Status: AC
  Filled 2023-08-21: qty 2

## 2023-08-21 MED ORDER — GLYCOPYRROLATE 0.2 MG/ML IJ SOLN
INTRAMUSCULAR | Status: AC
  Filled 2023-08-21: qty 1

## 2023-08-21 MED ORDER — CHLORHEXIDINE GLUCONATE CLOTH 2 % EX PADS: 6.0000 | MEDICATED_PAD | Freq: Once | CUTANEOUS | Status: DC

## 2023-08-21 MED ORDER — PROPOFOL 10 MG/ML IV BOLUS
INTRAVENOUS | Status: DC | PRN
Start: 2023-08-21 — End: 2023-08-21
  Administered 2023-08-21: 200 mg via INTRAVENOUS

## 2023-08-21 MED ORDER — SODIUM CHLORIDE 0.9 % IV SOLN: INTRAVENOUS | Status: DC

## 2023-08-21 MED ORDER — GABAPENTIN 300 MG PO CAPS
300.0000 mg | ORAL_CAPSULE | ORAL | Status: AC
  Administered 2023-08-21: 300 mg via ORAL
  Filled 2023-08-21: qty 1

## 2023-08-21 MED ORDER — ACETAMINOPHEN 325 MG PO TABS
650.0000 mg | ORAL_TABLET | Freq: Four times a day (QID) | ORAL | Status: DC | PRN
  Administered 2023-08-21 – 2023-08-22 (×3): 650 mg via ORAL
  Filled 2023-08-21 (×3): qty 2

## 2023-08-21 MED ORDER — FENTANYL CITRATE (PF) 100 MCG/2ML IJ SOLN
INTRAMUSCULAR | Status: AC
  Filled 2023-08-21: qty 2

## 2023-08-21 MED ORDER — GLYCOPYRROLATE 0.2 MG/ML IJ SOLN
INTRAMUSCULAR | Status: DC | PRN
  Administered 2023-08-21: .1 mg via INTRAVENOUS

## 2023-08-21 MED ORDER — LIDOCAINE HCL (PF) 2 % IJ SOLN
INTRAMUSCULAR | Status: AC
  Filled 2023-08-21: qty 5

## 2023-08-21 MED ORDER — ONDANSETRON HCL 4 MG/2ML IJ SOLN
INTRAMUSCULAR | Status: AC
  Filled 2023-08-21: qty 2

## 2023-08-21 MED ORDER — ENOXAPARIN SODIUM 40 MG/0.4ML IJ SOSY
40.0000 mg | PREFILLED_SYRINGE | INTRAMUSCULAR | Status: DC
  Administered 2023-08-22: 40 mg via SUBCUTANEOUS
  Filled 2023-08-21: qty 0.4

## 2023-08-21 MED ORDER — PHENYLEPHRINE 80 MCG/ML (10ML) SYRINGE FOR IV PUSH (FOR BLOOD PRESSURE SUPPORT)
PREFILLED_SYRINGE | INTRAVENOUS | Status: AC
  Filled 2023-08-21: qty 10

## 2023-08-21 MED ORDER — MEPERIDINE HCL 50 MG/ML IJ SOLN: 6.2500 mg | INTRAMUSCULAR | Status: DC | PRN

## 2023-08-21 MED ORDER — ONDANSETRON HCL 4 MG/2ML IJ SOLN: 4.0000 mg | Freq: Four times a day (QID) | INTRAMUSCULAR | Status: DC | PRN

## 2023-08-21 MED ORDER — ONDANSETRON 4 MG PO TBDP: 4.0000 mg | ORAL_TABLET | Freq: Four times a day (QID) | ORAL | Status: DC | PRN

## 2023-08-21 MED ORDER — HYDROMORPHONE HCL 1 MG/ML IJ SOLN
INTRAMUSCULAR | Status: AC
  Filled 2023-08-21: qty 1

## 2023-08-21 MED ORDER — HYDROMORPHONE HCL 2 MG/ML IJ SOLN
INTRAMUSCULAR | Status: AC
  Filled 2023-08-21: qty 1

## 2023-08-21 MED ORDER — PROMETHAZINE HCL 25 MG/ML IJ SOLN: 6.2500 mg | INTRAMUSCULAR | Status: DC | PRN

## 2023-08-21 MED ORDER — HYDROMORPHONE HCL 1 MG/ML IJ SOLN
0.2500 mg | INTRAMUSCULAR | Status: DC | PRN
  Administered 2023-08-21 (×2): 0.5 mg via INTRAVENOUS

## 2023-08-21 MED ORDER — OXYCODONE HCL 5 MG/5ML PO SOLN: 5.0000 mg | Freq: Once | ORAL | Status: DC | PRN

## 2023-08-21 MED ORDER — HYDROMORPHONE HCL 1 MG/ML IJ SOLN
INTRAMUSCULAR | Status: DC | PRN
  Administered 2023-08-21: .5 mg via INTRAVENOUS

## 2023-08-21 MED ORDER — OXYCODONE HCL 5 MG PO TABS: 5.0000 mg | ORAL_TABLET | Freq: Once | ORAL | Status: DC | PRN

## 2023-08-21 MED ORDER — BUPIVACAINE-EPINEPHRINE 0.25% -1:200000 IJ SOLN
INTRAMUSCULAR | Status: DC | PRN
  Administered 2023-08-21: 20 mL

## 2023-08-21 MED ORDER — ONDANSETRON HCL 4 MG/2ML IJ SOLN
INTRAMUSCULAR | Status: DC | PRN
  Administered 2023-08-21: 4 mg via INTRAVENOUS

## 2023-08-21 MED ORDER — MIDAZOLAM HCL 2 MG/2ML IJ SOLN: 0.5000 mg | Freq: Once | INTRAMUSCULAR | Status: DC | PRN

## 2023-08-21 MED ORDER — BUPIVACAINE-EPINEPHRINE 0.25% -1:200000 IJ SOLN
INTRAMUSCULAR | Status: AC
  Filled 2023-08-21: qty 1

## 2023-08-21 MED ORDER — VANCOMYCIN HCL 1500 MG/300ML IV SOLN
1500.0000 mg | INTRAVENOUS | Status: AC
  Administered 2023-08-21: 1500 mg via INTRAVENOUS
  Filled 2023-08-21: qty 300

## 2023-08-21 MED ORDER — LISINOPRIL 20 MG PO TABS
40.0000 mg | ORAL_TABLET | Freq: Every morning | ORAL | Status: DC
  Administered 2023-08-22: 40 mg via ORAL
  Filled 2023-08-21: qty 2

## 2023-08-21 MED ORDER — LIDOCAINE HCL (CARDIAC) PF 100 MG/5ML IV SOSY
PREFILLED_SYRINGE | INTRAVENOUS | Status: DC | PRN
  Administered 2023-08-21: 20 mg via INTRAVENOUS

## 2023-08-21 MED ORDER — OXYCODONE HCL 5 MG PO TABS
5.0000 mg | ORAL_TABLET | ORAL | Status: DC | PRN
  Administered 2023-08-22: 10 mg via ORAL
  Filled 2023-08-21: qty 2

## 2023-08-21 MED ORDER — DEXAMETHASONE SODIUM PHOSPHATE 4 MG/ML IJ SOLN
INTRAMUSCULAR | Status: DC | PRN
  Administered 2023-08-21: 4 mg via INTRAVENOUS

## 2023-08-21 MED ORDER — DIPHENHYDRAMINE HCL 50 MG/ML IJ SOLN: 25.0000 mg | Freq: Four times a day (QID) | INTRAMUSCULAR | Status: DC | PRN

## 2023-08-21 MED ORDER — PHENYLEPHRINE HCL (PRESSORS) 10 MG/ML IV SOLN
INTRAVENOUS | Status: DC | PRN
  Administered 2023-08-21 (×2): 80 ug via INTRAVENOUS
  Administered 2023-08-21: 160 ug via INTRAVENOUS

## 2023-08-21 SURGICAL SUPPLY — 29 items
BAG COUNTER SPONGE SURGICOUNT (BAG) IMPLANT
BAG SPNG CNTER NS LX DISP (BAG)
BLADE SURG 15 STRL LF DISP TIS (BLADE) ×1 IMPLANT
BLADE SURG 15 STRL SS (BLADE) ×1
BNDG GAUZE DERMACEA FLUFF 4 (GAUZE/BANDAGES/DRESSINGS) IMPLANT
BNDG GZE DERMACEA 4 6PLY (GAUZE/BANDAGES/DRESSINGS) ×1
DRAPE LAPAROSCOPIC ABDOMINAL (DRAPES) IMPLANT
ELECT REM PT RETURN 15FT ADLT (MISCELLANEOUS) ×1 IMPLANT
GAUZE PAD ABD 8X10 STRL (GAUZE/BANDAGES/DRESSINGS) IMPLANT
GAUZE SPONGE 4X4 12PLY STRL (GAUZE/BANDAGES/DRESSINGS) IMPLANT
GLOVE BIO SURGEON STRL SZ7.5 (GLOVE) ×1 IMPLANT
GOWN STRL REUS W/ TWL LRG LVL3 (GOWN DISPOSABLE) ×2 IMPLANT
GOWN STRL REUS W/TWL LRG LVL3 (GOWN DISPOSABLE) ×2
KIT BASIN OR (CUSTOM PROCEDURE TRAY) ×1 IMPLANT
KIT TURNOVER KIT A (KITS) IMPLANT
NDL HYPO 25X1 1.5 SAFETY (NEEDLE) IMPLANT
NEEDLE HYPO 25X1 1.5 SAFETY (NEEDLE) IMPLANT
NS IRRIG 1000ML POUR BTL (IV SOLUTION) ×1 IMPLANT
PENCIL SMOKE EVACUATOR (MISCELLANEOUS) IMPLANT
SPIKE FLUID TRANSFER (MISCELLANEOUS) IMPLANT
SUT MNCRL AB 4-0 PS2 18 (SUTURE) IMPLANT
SUT VIC AB 3-0 SH 27 (SUTURE)
SUT VIC AB 3-0 SH 27XBRD (SUTURE) IMPLANT
SWAB COLLECTION DEVICE MRSA (MISCELLANEOUS) IMPLANT
SWAB CULTURE ESWAB REG 1ML (MISCELLANEOUS) IMPLANT
SYR BULB EAR ULCER 3OZ GRN STR (SYRINGE) ×1 IMPLANT
SYR CONTROL 10ML LL (SYRINGE) IMPLANT
TOWEL OR 17X26 10 PK STRL BLUE (TOWEL DISPOSABLE) ×1 IMPLANT
YANKAUER SUCT BULB TIP NO VENT (SUCTIONS) ×1 IMPLANT

## 2023-08-21 NOTE — Anesthesia Postprocedure Evaluation (Signed)
Anesthesia Post Note  Patient: Natalie Harris  Procedure(s) Performed: INCISION AND DEBRIDEMENT OF GROIN ABSCESS     Patient location during evaluation: PACU Anesthesia Type: General Level of consciousness: awake and alert, patient cooperative and oriented Pain management: pain level controlled Vital Signs Assessment: post-procedure vital signs reviewed and stable Respiratory status: spontaneous breathing, nonlabored ventilation, respiratory function stable and patient connected to nasal cannula oxygen Cardiovascular status: blood pressure returned to baseline and stable Postop Assessment: no apparent nausea or vomiting Anesthetic complications: no   No notable events documented.  Last Vitals:  Vitals:   08/21/23 1145 08/21/23 1200  BP: (!) 107/55 131/65  Pulse: 65 68  Resp: 12 16  Temp: 36.4 C (!) 36.4 C  SpO2: 100% 100%    Last Pain:  Vitals:   08/21/23 1145  TempSrc:   PainSc: Asleep                 Graviel Payeur,E. Salvator Seppala

## 2023-08-21 NOTE — Interval H&P Note (Signed)
History and Physical Interval Note:  08/21/2023 9:19 AM  Natalie Harris  has presented today for surgery, with the diagnosis of ABSCESS.  The various methods of treatment have been discussed with the patient and family. After consideration of risks, benefits and other options for treatment, the patient has consented to  Procedure(s): INCISION AND DEBRIDEMENT OF GROIN ABSCESS (N/A) as a surgical intervention.  The patient's history has been reviewed, patient examined, no change in status, stable for surgery.  I have reviewed the patient's chart and labs.  Questions were answered to the patient's satisfaction.     Natalie Harris

## 2023-08-21 NOTE — Anesthesia Procedure Notes (Signed)
Procedure Name: LMA Insertion Date/Time: 08/21/2023 10:28 AM  Performed by: Ahmed Prima, CRNAPre-anesthesia Checklist: Patient identified, Emergency Drugs available, Suction available and Patient being monitored Patient Re-evaluated:Patient Re-evaluated prior to induction Oxygen Delivery Method: Circle System Utilized Preoxygenation: Pre-oxygenation with 100% oxygen Induction Type: IV induction Ventilation: Mask ventilation without difficulty LMA: LMA inserted LMA Size: 4.0 Number of attempts: 1 Airway Equipment and Method: Bite block Placement Confirmation: positive ETCO2 Tube secured with: Tape Dental Injury: Teeth and Oropharynx as per pre-operative assessment

## 2023-08-21 NOTE — Plan of Care (Signed)

## 2023-08-21 NOTE — Op Note (Signed)
Pre-op diagnosis: Subcutaneous abscess right groin Postop diagnosis: Same Procedure performed: Incision and drainage of subcutaneous abscess right groin (15 x 4 cm) Surgeon:Aija Scarfo K Sujey Gundry Procedure: General Indications: This is a 62 year old female who was referred urgently to our office yesterday with a 10-day history of a worsening soft tissue infection of the right groin and right side of her mons pubis.  She has been treated with antibiotics and had an attempted an incision and drainage at urgent care.  This has continued to worsen.  She presented to our office for evaluation.  It was felt that she needed surgical debridement.  She was admitted this morning for undergo surgical debridement.  Operative findings: There is a 1 cm opening with some purulent drainage.  This enters a long tunnel of abscess cavity that heads laterally for approximately 12 cm and medially about 3 cm.  This involves only the subcutaneous tissue and does not extend down to the underlying muscle.  The abscess cavity does not extend into the labia.  Description of procedure: The patient is brought to the operating room and placed in the supine position on the operating table.  After an adequate level general anesthesia was obtained, the patient's lower abdomen and pelvis was prepped with Betadine and draped sterile fashion.  A timeout was taken to ensure the proper patient and proper procedure.  I inserted a hemostat into the opening.  This seems to track laterally for several centimeters.  It tracks medially for a few centimeters.  Again I cultured the drainage coming out of the wound and sent this to microbiology.  We then excised the overlying skin and subcutaneous tissue over the abscess cavity heading lateral.  The abscess cavity widely.  I debrided some necrotic tissue.  We irrigated thoroughly.  No further purulence was noted.  Inspected for hemostasis.  The wound was packed with saline moistened gauze.  Dry dressing was  applied.  Patient was extubated and brought to recovery room in stable condition.  All sponge, instrument, and needle counts are correct.  Wilmon Arms. Corliss Skains, MD, [facility]  General Surgery   08/21/2023 10:56 AM

## 2023-08-21 NOTE — Transfer of Care (Signed)
Immediate Anesthesia Transfer of Care Note  Patient: Natalie Harris  Procedure(s) Performed: INCISION AND DEBRIDEMENT OF GROIN ABSCESS  Patient Location: PACU  Anesthesia Type:General  Level of Consciousness: awake, alert , and oriented  Airway & Oxygen Therapy: Patient Spontanous Breathing and Patient connected to face mask oxygen  Post-op Assessment: Report given to RN and Post -op Vital signs reviewed and stable  Post vital signs: Reviewed and stable  Last Vitals:  Vitals Value Taken Time  BP 117/57 08/21/23 1101  Temp    Pulse 68 08/21/23 1104  Resp 18 08/21/23 1104  SpO2 100 % 08/21/23 1104  Vitals shown include unfiled device data.  Last Pain:  Vitals:   08/21/23 0941  TempSrc:   PainSc: 6       Patients Stated Pain Goal: 4 (08/21/23 0941)  Complications: No notable events documented.

## 2023-08-21 NOTE — Progress Notes (Signed)
Patient aware NPO and to arrive at 0900 for surgery today

## 2023-08-21 NOTE — Anesthesia Preprocedure Evaluation (Addendum)
Anesthesia Evaluation  Patient identified by MRN, date of birth, ID band Patient awake    Reviewed: Allergy & Precautions, NPO status , Patient's Chart, lab work & pertinent test results  History of Anesthesia Complications Negative for: history of anesthetic complications  Airway Mallampati: II  TM Distance: >3 FB Neck ROM: Full    Dental  (+) Dental Advisory Given   Pulmonary neg pulmonary ROS   breath sounds clear to auscultation       Cardiovascular hypertension, Pt. on medications (-) angina  Rhythm:Regular Rate:Normal     Neuro/Psych negative neurological ROS     GI/Hepatic negative GI ROS, Neg liver ROS,,,  Endo/Other  BMI 36  Renal/GU negative Renal ROS     Musculoskeletal   Abdominal   Peds  Hematology negative hematology ROS (+)   Anesthesia Other Findings   Reproductive/Obstetrics                             Anesthesia Physical Anesthesia Plan  ASA: 2  Anesthesia Plan: General   Post-op Pain Management: Tylenol PO (pre-op)*   Induction: Intravenous  PONV Risk Score and Plan: 3 and Ondansetron, Dexamethasone and Treatment may vary due to age or medical condition  Airway Management Planned: LMA  Additional Equipment: None  Intra-op Plan:   Post-operative Plan:   Informed Consent: I have reviewed the patients History and Physical, chart, labs and discussed the procedure including the risks, benefits and alternatives for the proposed anesthesia with the patient or authorized representative who has indicated his/her understanding and acceptance.     Dental advisory given  Plan Discussed with: CRNA and Surgeon  Anesthesia Plan Comments:         Anesthesia Quick Evaluation

## 2023-08-22 ENCOUNTER — Encounter (HOSPITAL_COMMUNITY): Payer: Self-pay | Admitting: Surgery

## 2023-08-22 DIAGNOSIS — L02214 Cutaneous abscess of groin: Secondary | ICD-10-CM | POA: Diagnosis not present

## 2023-08-22 LAB — GLUCOSE, CAPILLARY: Glucose-Capillary: 90 mg/dL (ref 70–99)

## 2023-08-22 LAB — SURGICAL PATHOLOGY

## 2023-08-22 MED ORDER — OXYCODONE HCL 5 MG PO TABS: 5.0000 mg | ORAL_TABLET | Freq: Four times a day (QID) | ORAL | 0 refills | Status: DC | PRN

## 2023-08-22 MED ORDER — DOXYCYCLINE HYCLATE 50 MG PO CAPS: 50.0000 mg | ORAL_CAPSULE | Freq: Two times a day (BID) | ORAL | 0 refills | Status: DC

## 2023-08-22 NOTE — Discharge Summary (Signed)
  [facility] Discharge Summary   Patient ID: Natalie Harris MRN: [id number] DOB/AGE: 05/28/1961 62 y.o.  Admit date: 08/21/2023 Discharge date: 08/22/2023  Admitting Diagnosis: Subcutaneous abscess right groin   Discharge Diagnosis Patient Active Problem List   Diagnosis Date Noted   Abscess of right groin 08/21/2023    Consultants None  Imaging: No results found.  Procedures Dr. Corliss Skains (08/21/2023) - Incision and drainage of subcutaneous abscess right groin (15 x 4 cm)   Hospital Course:  Natalie Harris is a 62 y.o. female who was seen in our office 8/21 with a persistent soft tissue infection of the mons. She was admitted to the hospital and underwent Incision and drainage of subcutaneous abscess right groin (15 x 4 cm) on 8/22. Tolerated procedure well and was transferred to the floor.  Cultures sent intra-op and gram stain growing gram positive cocci at time of discharge. Wound cleaned up well and patient tolerated dressing changes. On POD1, the patient was voiding well, tolerating diet, ambulating well, pain well controlled, vital signs stable, incisions c/d/i and felt stable for discharge home. She will be sent home with 1 week of doxycycline.  Patient will follow up as below and knows to call with questions or concerns.    I have personally reviewed the patients medication history on the [place] controlled substance database.    Physical Exam: General:  Alert, NAD, pleasant, comfortable Pulm: rate and effort normal Abd:  Soft, ND, NT Groin: right groin wound pictured below with healthy granulation tissue and no purulent drainage, improving cellulitis    Allergies as of 08/22/2023       Reactions   Amoxicillin Hives, Rash        Medication List     STOP taking these medications    clindamycin 300 MG capsule Commonly known as: CLEOCIN       TAKE these medications    cetirizine 10 MG tablet Commonly known as: ZYRTEC Take 10 mg by mouth in  the morning.   doxycycline 50 MG capsule Commonly known as: VIBRAMYCIN Take 1 capsule (50 mg total) by mouth 2 (two) times daily.   ibuprofen 200 MG tablet Commonly known as: ADVIL Take 400-800 mg by mouth every 8 (eight) hours as needed (pain.).   lisinopril 40 MG tablet Commonly known as: ZESTRIL Take 40 mg by mouth in the morning.   mupirocin ointment 2 % Commonly known as: BACTROBAN Apply 1 Application topically 2 (two) times daily.   oxyCODONE 5 MG immediate release tablet Commonly known as: Oxy IR/ROXICODONE Take 1 tablet (5 mg total) by mouth every 6 (six) hours as needed for severe pain.         Follow-up Information     [facility], Hedda Slade, [place]. Go on 09/09/2023.   Specialty: General Surgery Why: Your appointment is 9/10 at 11am please arrive 15 minutes early for check in Contact information: [address] [place] [address] [phone]                    Signed: Franne Forts, PA-C [facility] 08/22/2023, 10:20 AM Please see Amion for pager number during day hours 7:00am-4:30pm

## 2023-08-22 NOTE — Progress Notes (Signed)
   08/22/23 1040  TOC Brief Assessment  Insurance and Status Reviewed  Patient has primary care physician Yes  Home environment has been reviewed Spouse/family  Prior level of function: independent  Prior/Current Home Services No current home services  Social Determinants of Health Reivew SDOH reviewed no interventions necessary  Readmission risk has been reviewed Yes  Transition of care needs no transition of care needs at this time

## 2023-08-22 NOTE — Discharge Instructions (Signed)
Wet to Dry WOUND CARE: - Change dressing 1-2 times daily - Supplies: sterile saline, kerlex, scissors, ABD pads, tape  Remove dressing and all packing carefully, moistening with sterile saline as needed to avoid packing/internal dressing sticking to the wound. 2.   Clean edges of skin around the wound with water/gauze, making sure there is no tape debris or leakage left on skin that could cause skin irritation or breakdown. 3.   Dampen and clean kerlex with sterile saline and pack wound from wound base to skin level, making sure to take note of any possible areas of wound tracking, tunneling and packing appropriately. Wound can be packed loosely. Trim kerlex to size if a whole kerlex is not required. 4.   Cover wound with a dry ABD pad and secure with tape.  5.   Write the date/time on the dry dressing/tape to better track when the last dressing change occurred. - apply any skin protectant/powder if recommended by clinician to protect skin/skin folds. - change dressing as needed if leakage occurs, wound gets contaminated, or patient requests to shower. - You may shower daily with wound open and following the shower the wound should be dried and a clean dressing placed.  - Medical grade tape as well as packing supplies can be found at [employer] on [address] or [employer] on [address]. The remaining supplies can be found at your local drug store, [employer] etc.

## 2023-08-26 LAB — AEROBIC/ANAEROBIC CULTURE W GRAM STAIN (SURGICAL/DEEP WOUND)

## 2024-01-19 ENCOUNTER — Encounter: Payer: Self-pay | Admitting: Adult Health

## 2024-01-19 ENCOUNTER — Ambulatory Visit: Payer: 59 | Admitting: Adult Health

## 2024-01-19 VITALS — BP 142/77 | HR 63 | Ht 65.0 in | Wt 221.0 lb

## 2024-01-19 DIAGNOSIS — Z1331 Encounter for screening for depression: Secondary | ICD-10-CM | POA: Diagnosis not present

## 2024-01-19 DIAGNOSIS — I10 Essential (primary) hypertension: Secondary | ICD-10-CM

## 2024-01-19 DIAGNOSIS — N764 Abscess of vulva: Secondary | ICD-10-CM | POA: Diagnosis not present

## 2024-01-19 MED ORDER — SILVER SULFADIAZINE 1 % EX CREA
1.0000 | TOPICAL_CREAM | Freq: Two times a day (BID) | CUTANEOUS | 0 refills | Status: AC
Start: 2024-01-19 — End: ?

## 2024-01-19 MED ORDER — DOXYCYCLINE HYCLATE 100 MG PO TABS: 100.0000 mg | ORAL_TABLET | Freq: Two times a day (BID) | ORAL | 0 refills | Status: DC

## 2024-01-19 NOTE — Progress Notes (Signed)
Subjective:     Patient ID: Natalie Harris, female   DOB: June 03, 1961, 63 y.o.   MRN: [id number]  HPI Natalie Harris is a 63 year old white female, married, sp hysterectomy, worked if for vulva boil, has swelling, started 01/15/24 and called Dr Ouida Sills and started on doxycycline Friday, she saw him this morning. She had I&D last August same side. She says it is strange this happens at beginning of a college semester, she works at [employer].  PCP is Dr Ouida Sills.  Review of Systems +vulva boil, has swelling, started 01/15/24 and called Dr Ouida Sills and started on doxycycline Friday, she saw him this morning. She had I&D last August same side She is not diabetic that she is aware.    Reviewed past medical,surgical, social and family history. Reviewed medications and allergies.  Objective:   Physical Exam BP (!) 142/77 (BP Location: Right Arm, Patient Position: Sitting, Cuff Size: Normal)   Pulse 63   Ht 5\' 5"  (1.651 m)   Wt 221 lb (100.2 kg)   BMI 36.78 kg/m   Skin warm and dry. Lungs: clear to ausculation bilaterally. Cardiovascular: regular rate and rhythm.  Pelvic: external genitalia, right labia is slight red and swollen, redness extending into mons pubis, has firmness at old incision on mons pubis, there is a small opening at base of right labia ,that when pressure applied drains, purulent fluid, culture obtained, Dr Despina Hidden in for co exam,vagina: pale,urethra has no lesions or masses noted, cervix and uterus is absent, adnexa: no masses or tenderness noted. Bladder is non tender and no masses felt.  AA is 5 Fall risk is low    01/19/2024    9:03 AM  Depression screen PHQ 2/9  Decreased Interest 0  Down, Depressed, Hopeless 0  PHQ - 2 Score 0  Altered sleeping 0  Tired, decreased energy 1  Change in appetite 0  Feeling bad or failure about yourself  0  Trouble concentrating 0  Moving slowly or fidgety/restless 0  Suicidal thoughts 0  PHQ-9 Score 1       01/19/2024    9:03 AM  GAD 7 : Generalized  Anxiety Score  Nervous, Anxious, on Edge 0  Control/stop worrying 0  Worry too much - different things 0  Trouble relaxing 0  Restless 0  Easily annoyed or irritable 0  Afraid - awful might happen 0  Total GAD 7 Score 0      Upstream - 01/19/24 0902       Pregnancy Intention Screening   Does the patient want to become pregnant in the next year? N/A    Does the patient's partner want to become pregnant in the next year? N/A    Would the patient like to discuss contraceptive options today? N/A      Contraception Wrap Up   Current Method Female Sterilization   hyst   End Method Female Sterilization   hyst   Contraception Counseling Provided No            Examination chaperoned by Malachy Mood LPN     Assessment:     1. Vulvar abscess (Primary) +swelling and redness right labia, starting to drain  Would culture obtained and sent Will extend doxycyline 100 mg to 14 days Apply silvadene bid  Continue warm compresses Use boil eze, coal tar or something like that to draw it out  Meds ordered this encounter  Medications   silver sulfADIAZINE (SILVADENE) 1 % cream    Sig: Apply 1  Application topically 2 (two) times daily.    Dispense:  50 g    Refill:  0    Supervising Provider:   Duane Lope H [[id number]]   doxycycline (VIBRA-TABS) 100 MG tablet    Sig: Take 1 tablet (100 mg total) by mouth 2 (two) times daily.    Dispense:  14 tablet    Refill:  0    Supervising Provider:   Duane Lope H [[id number]]     2. Hypertension, unspecified type Has not taken meds today Take meds Follow up with PCP     Plan:     Follow up in 1 week

## 2024-01-19 NOTE — Addendum Note (Signed)
Addended by: Colen Darling on: 01/19/2024 10:47 AM   Modules accepted: Orders

## 2024-01-22 ENCOUNTER — Telehealth: Payer: Self-pay | Admitting: Adult Health

## 2024-01-22 LAB — WOUND CULTURE: Organism ID, Bacteria: NONE SEEN

## 2024-01-22 MED ORDER — SULFAMETHOXAZOLE-TRIMETHOPRIM 800-160 MG PO TABS: 1.0000 | ORAL_TABLET | Freq: Two times a day (BID) | ORAL | 0 refills | Status: DC

## 2024-01-22 NOTE — Telephone Encounter (Signed)
Stop doxycycline will rx septra ds

## 2024-01-26 ENCOUNTER — Ambulatory Visit: Payer: 59 | Admitting: Adult Health

## 2024-01-26 ENCOUNTER — Encounter: Payer: Self-pay | Admitting: Adult Health

## 2024-01-26 VITALS — BP 133/71 | HR 64 | Ht 65.0 in | Wt 218.0 lb

## 2024-01-26 DIAGNOSIS — N764 Abscess of vulva: Secondary | ICD-10-CM

## 2024-01-26 NOTE — Progress Notes (Signed)
  Subjective:     Patient ID: Natalie Harris, female   DOB: 1961/09/05, 63 y.o.   MRN: [id number]  HPI Natalie Harris is a 63 year old white female, married, sp hysterectomy back in follow up on right vulva abscess, was on doxycycline but changed to septra ds 01/22/24 and is feeling much better, still using silvadene, uses boil eze at bedtime, but no drainage since Friday. 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   PCP is Dr Ouida Sills.   Review of Systems Feels much better No draining now Reviewed past medical,surgical, social and family history. Reviewed medications and allergies.     Objective:   Physical Exam BP 133/71 (BP Location: Left Arm, Patient Position: Sitting, Cuff Size: Normal)   Pulse 64   Ht 5\' 5"  (1.651 m)   Wt 218 lb (98.9 kg)   BMI 36.28 kg/m     Skin warm and dry.Pelvic: external genitalia is normal in appearance, no redness or swelling, no drainage, is non tender on the right labia. Pt gave verbal consent for exam with chaperone.    Upstream - 01/26/24 1028       Pregnancy Intention Screening   Does the patient want to become pregnant in the next year? N/A    Does the patient's partner want to become pregnant in the next year? N/A    Would the patient like to discuss contraceptive options today? N/A      Contraception Wrap Up   Current Method Female Sterilization   hyst   End Method Female Sterilization   hyst   Contraception Counseling Provided No             Assessment:     1. Vulvar abscess (Primary), resolving Continue septra ds til finished  Use silvadene bid Can continue warm compresses  Will recheck in 2 weeks     Plan:     Follow up in 2 weeks for recheck

## 2024-02-09 ENCOUNTER — Encounter: Payer: Self-pay | Admitting: Adult Health

## 2024-02-09 ENCOUNTER — Ambulatory Visit: Payer: 59 | Admitting: Adult Health

## 2024-02-09 VITALS — BP 137/82 | HR 67 | Ht 65.0 in | Wt 218.0 lb

## 2024-02-09 DIAGNOSIS — N764 Abscess of vulva: Secondary | ICD-10-CM

## 2024-02-09 DIAGNOSIS — I10 Essential (primary) hypertension: Secondary | ICD-10-CM | POA: Diagnosis not present

## 2024-02-09 NOTE — Progress Notes (Signed)
  Subjective:     Patient ID: Natalie Harris, female   DOB: June 18, 1961, 63 y.o.   MRN: [id number]  HPI Natalie Harris is a 63 year old white female, married, sp hysterectomy back in follow up on vulva abscess, and it seems to be gone.  PCP is Dr Hildy Lowers  Review of Systems Abscess, is gone, no pain Reviewed past medical,surgical, social and family history. Reviewed medications and allergies.     Objective:   Physical Exam BP 137/82 (BP Location: Right Arm, Patient Position: Sitting, Cuff Size: Normal)   Pulse 67   Ht 5\' 5"  (1.651 m)   Wt 218 lb (98.9 kg)   BMI 36.28 kg/m     Skin warm and dry.Pelvic: external genitalia is normal in appearance, the abscess on the right has resolved, no redness, swelling or tenderness   Assessment:     1. Vulvar abscess (Primary), resolved   2. Hypertension, unspecified type Take BP meds and follow up with PCP     Plan:     Follow up prn
